# Patient Record
Sex: Female | Born: 1935 | Race: White | Hispanic: No | Marital: Married | Smoking: Never smoker
Health system: Southern US, Community
[De-identification: ages and names within clinical notes are randomized; demographics above are authoritative.]

## PROBLEM LIST (undated history)

## (undated) DIAGNOSIS — R059 Cough, unspecified: Secondary | ICD-10-CM

## (undated) DIAGNOSIS — F32A Depression, unspecified: Secondary | ICD-10-CM

## (undated) DIAGNOSIS — I10 Essential (primary) hypertension: Secondary | ICD-10-CM

## (undated) DIAGNOSIS — K589 Irritable bowel syndrome without diarrhea: Secondary | ICD-10-CM

## (undated) DIAGNOSIS — C4492 Squamous cell carcinoma of skin, unspecified: Secondary | ICD-10-CM

## (undated) DIAGNOSIS — R05 Cough: Secondary | ICD-10-CM

## (undated) DIAGNOSIS — G7100 Muscular dystrophy, unspecified: Secondary | ICD-10-CM

## (undated) DIAGNOSIS — Z974 Presence of external hearing-aid: Secondary | ICD-10-CM

## (undated) DIAGNOSIS — F329 Major depressive disorder, single episode, unspecified: Secondary | ICD-10-CM

## (undated) DIAGNOSIS — M1990 Unspecified osteoarthritis, unspecified site: Secondary | ICD-10-CM

## (undated) DIAGNOSIS — T753XXA Motion sickness, initial encounter: Secondary | ICD-10-CM

## (undated) HISTORY — PX: ABDOMINAL HYSTERECTOMY: SHX81

## (undated) HISTORY — DX: Squamous cell carcinoma of skin, unspecified: C44.92

---

## 2004-05-07 ENCOUNTER — Ambulatory Visit: Payer: Self-pay | Admitting: Urology

## 2004-05-07 IMAGING — CT CT ABD-PELV W/O CM
1 of 2 series · 15 of 32 positions shown, 19 images · non-contrast
Comparison: none

REASON FOR EXAM: Hematuria
COMMENTS:

[Series 2: soft tissue · axial · 0.59mm/px · z∈[-580,-262]mm · 15 of 120 slices shown, 19 images]
[im 9/120  soft-tissue]
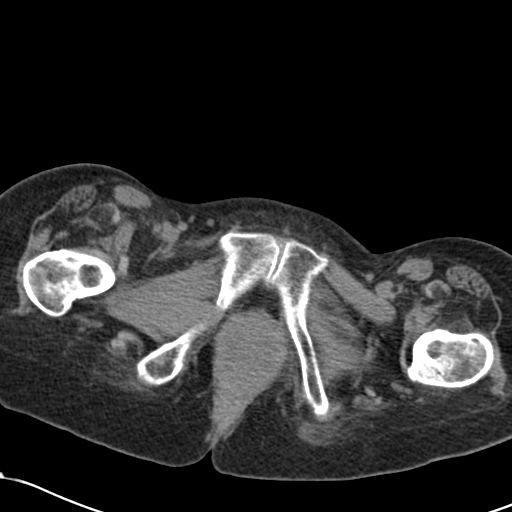
[im 9/120  bone]
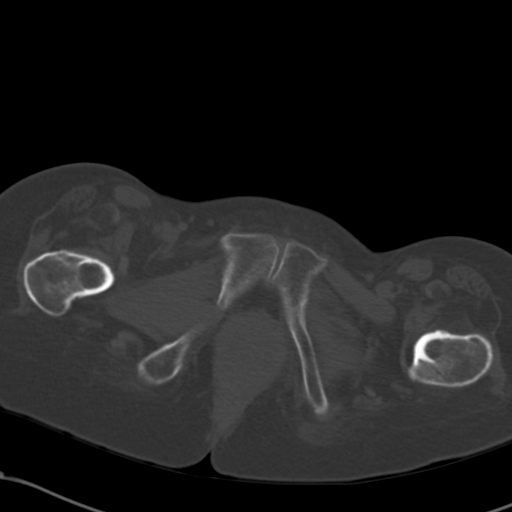
[im 17/120  soft-tissue]
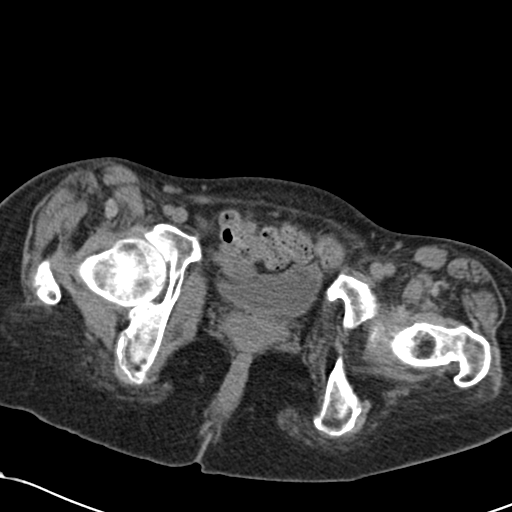
[im 25/120  soft-tissue]
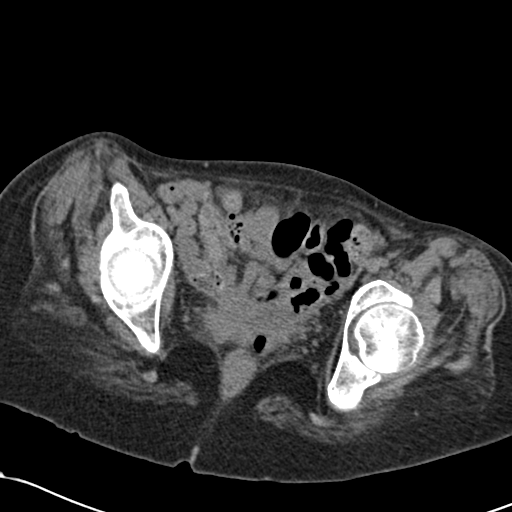
[im 33/120  soft-tissue]
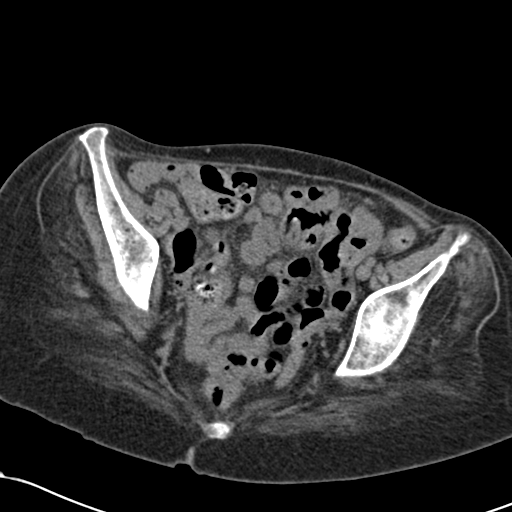
[im 42/120  soft-tissue]
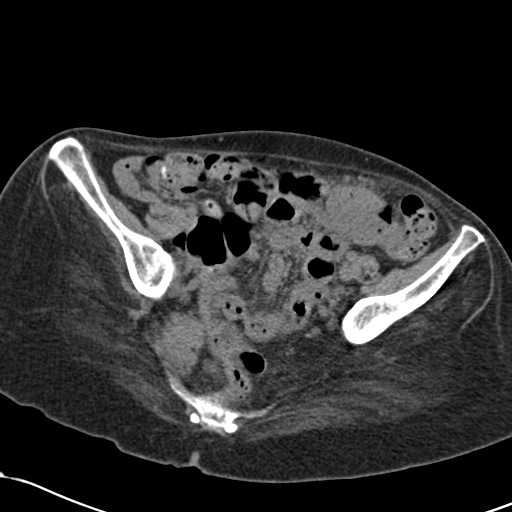
[im 50/120  soft-tissue]
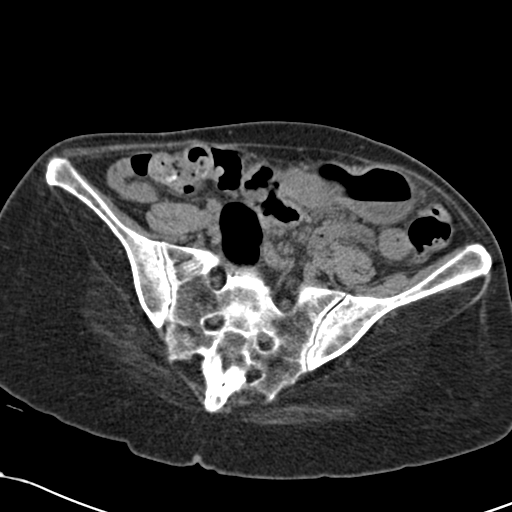
[im 62/120  soft-tissue]
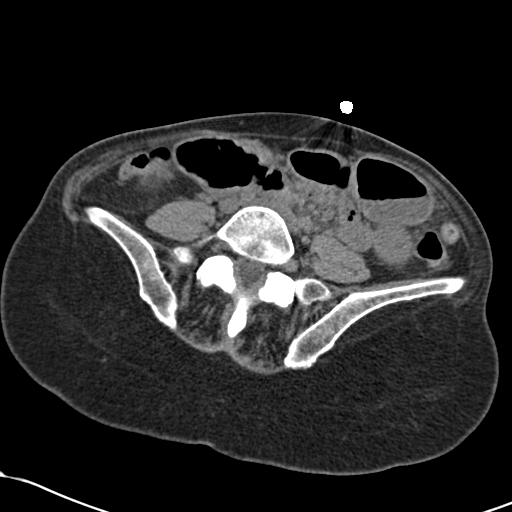
[im 70/120  soft-tissue]
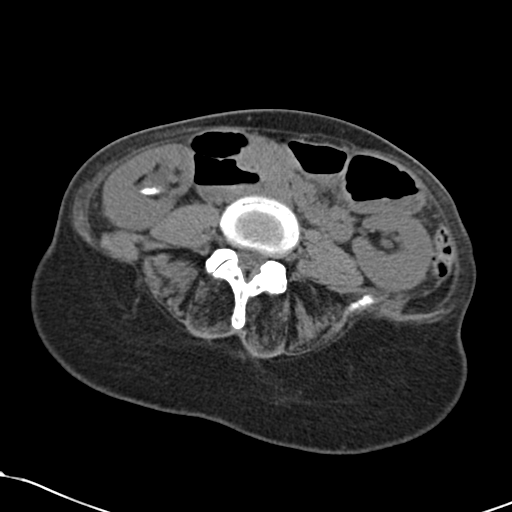
[im 78/120  soft-tissue]
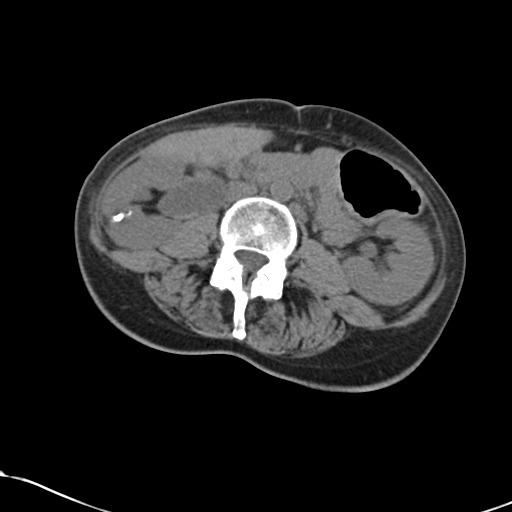
[im 78/120  bone]
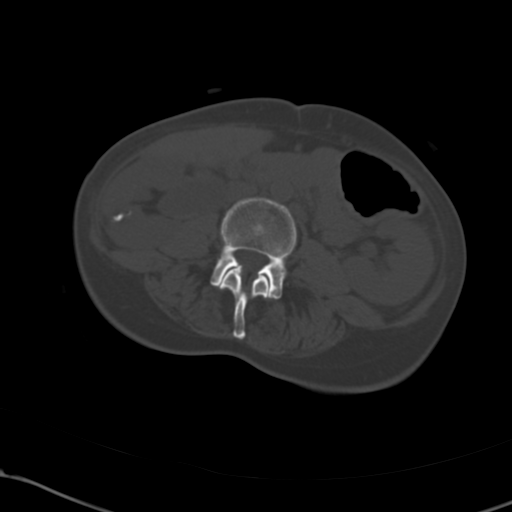
[im 87/120  soft-tissue]
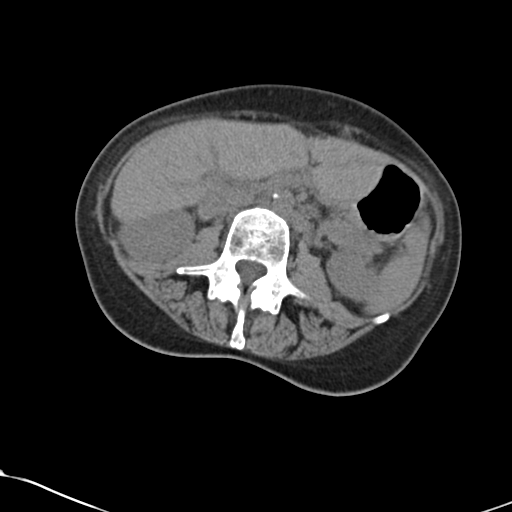
[im 95/120  soft-tissue]
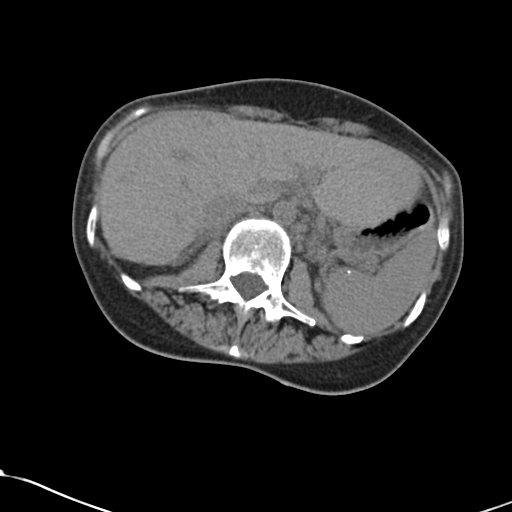
[im 103/120  soft-tissue]
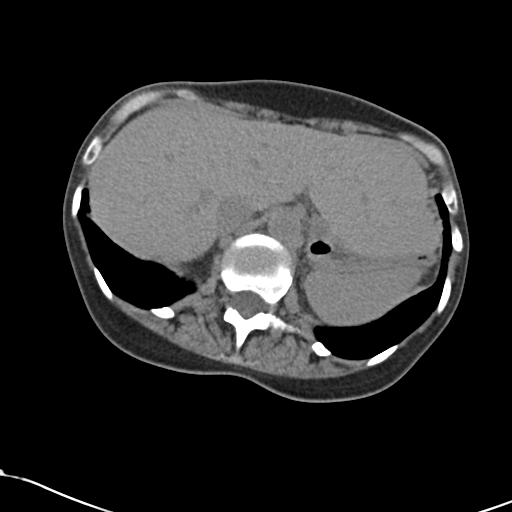
[im 103/120  lung]
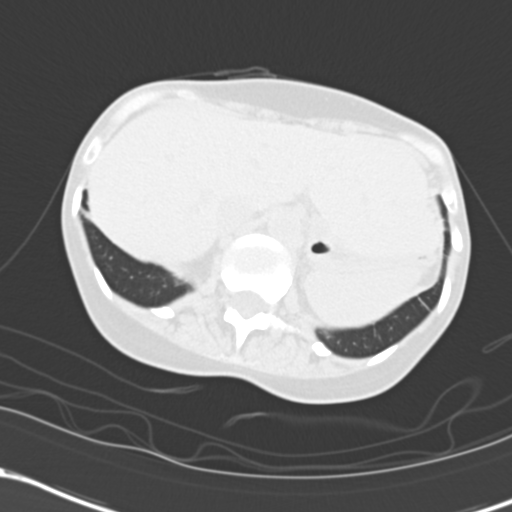
[im 107/120  lung]
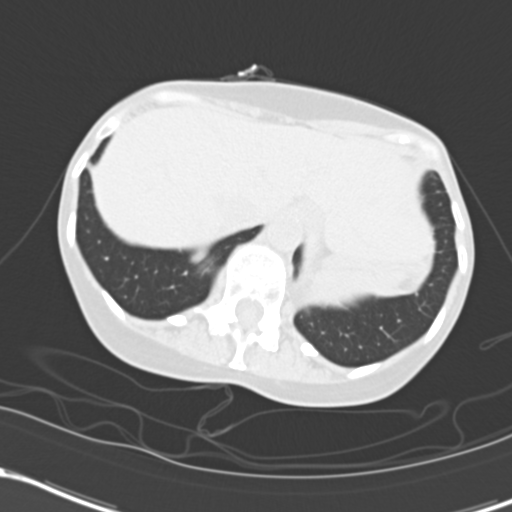
[im 111/120  soft-tissue]
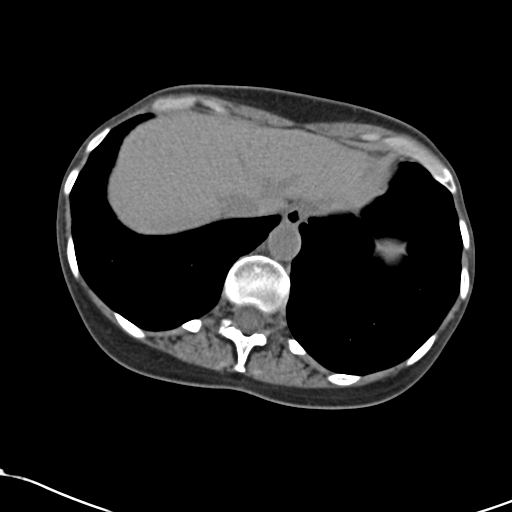
[im 111/120  lung]
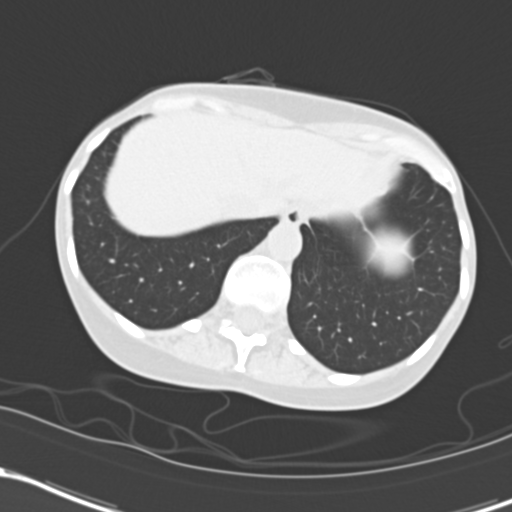
[im 115/120  lung]
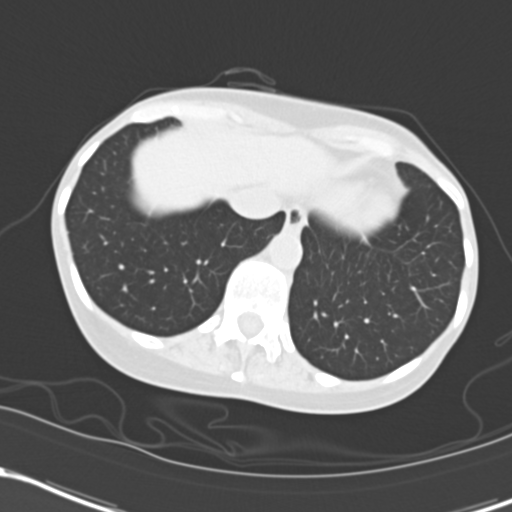

[15 of 32 positions shown; findings below may reference images not displayed]

PROCEDURE:     CT  - CT ABDOMEN AND PELVIS W[DATE]  [DATE]

RESULT:     The patient has unexplained hematuria.  The patient is
paraplegic.

The kidneys exhibit normal contour.  There is hydronephrosis on the RIGHT.
There are collecting system stones present.  There may be a midpole caliceal
stone as well.  The LEFT kidney exhibits mild prominence of the pelvis but a
definite stone on the LEFT is not seen.  I do not see an etiology for the
hydronephrosis on the RIGHT.  The RIGHT ureter can be followed only for a
short distance into the mid to lower abdomen.  There is a radiodensity seen
on images 82 through 85 that appears to be associated with a loop of bowel
but it lies along the expected course of the RIGHT ureter.  I cannot exclude
a stone here.  No UVJ stones are seen on the RIGHT or LEFT.  I see no free
fluid in the pelvis.  Considerable stool and gas is present within loops of
bowel in the abdomen and pelvis.  The uterus is small or surgically absent.
The lung bases appear clear.  The liver, spleen, nondistended stomach,
adrenal glands, and pancreas are grossly normal.  The caliber of the
abdominal aorta is normal.  The gallbladder is not definitely demonstrated
on this study.
IMPRESSION: There does appear to be mild hydronephrosis on the RIGHT.  A portion of this
may be related to extrarenal pelvis but there does indeed appear to be some
distention of the intrarenal collecting systems.  There are stones present
on the RIGHT, the largest of which measure approximately 5 mm in greatest
dimension.  A definite ureteral stone on the RIGHT is not seen.  There are
punctate non-obstructing stones present in the LEFT kidney.

There is a considerable amount of stool present throughout the small and
large bowel.  It is difficult to separate the ureters from these unopacified
bowel loops.

The gallbladder is not visualized on this study. It would be of value to
consider the patient for contrast-enhanced CT scanning of the abdomen and
pelvis or for an IVP if good preparation of the bowel can be obtained.

## 2004-05-17 ENCOUNTER — Ambulatory Visit: Payer: Self-pay | Admitting: Urology

## 2004-05-17 IMAGING — CR DG IVP HYPERTENSIVE
1 series · 8 of 10 positions shown · non-contrast
Comparison: none

REASON FOR EXAM: ALLERGY PREP. Hematuria
COMMENTS:

[Series 1: view not recorded · 0.17mm/px · 8 of 17 slices shown]
[im 1/17]
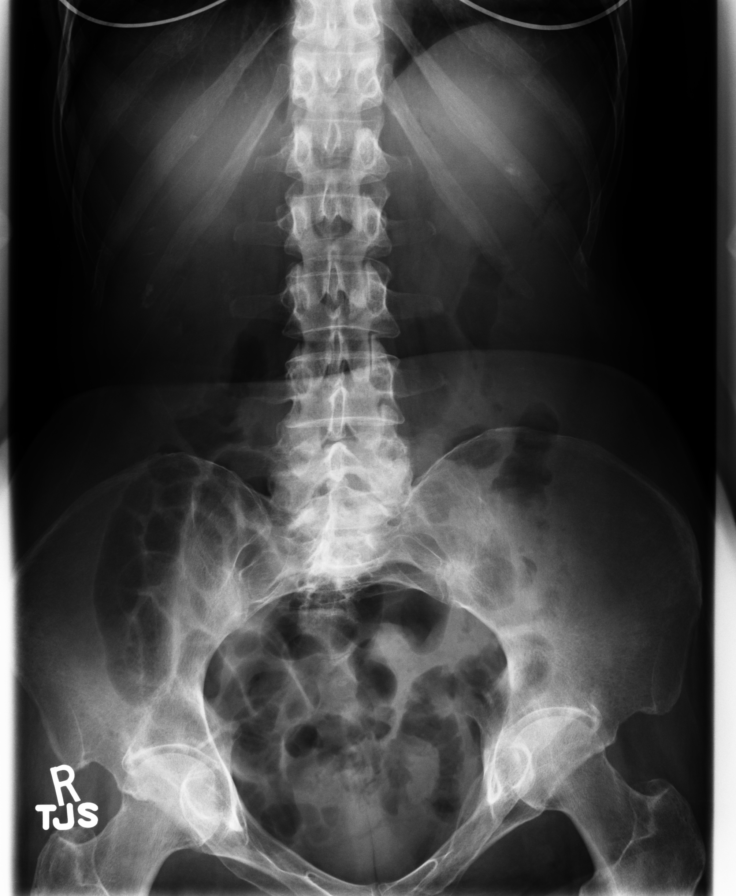
[im 2/17]
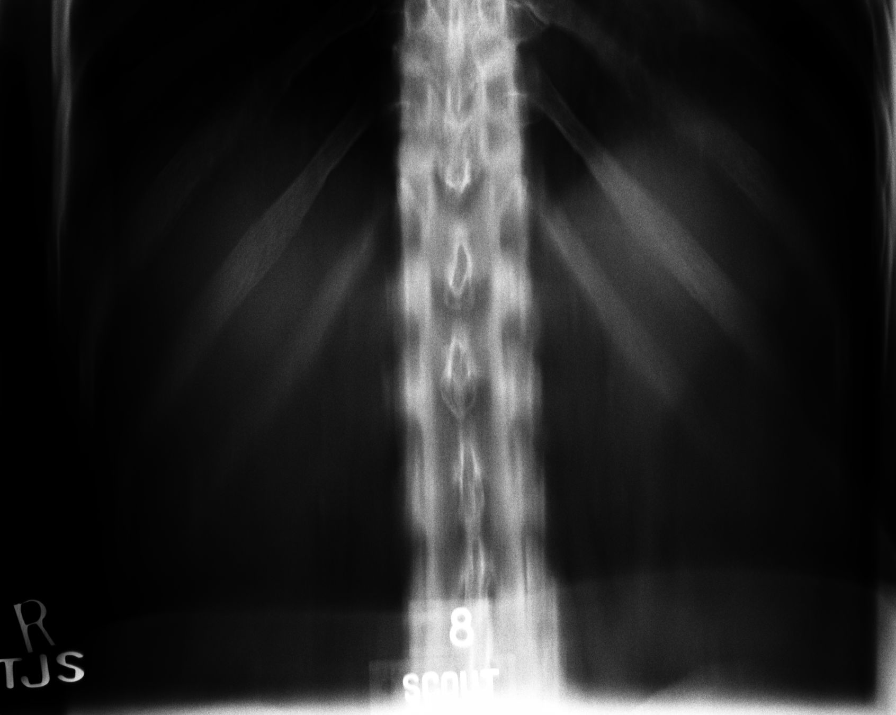
[im 4/17]
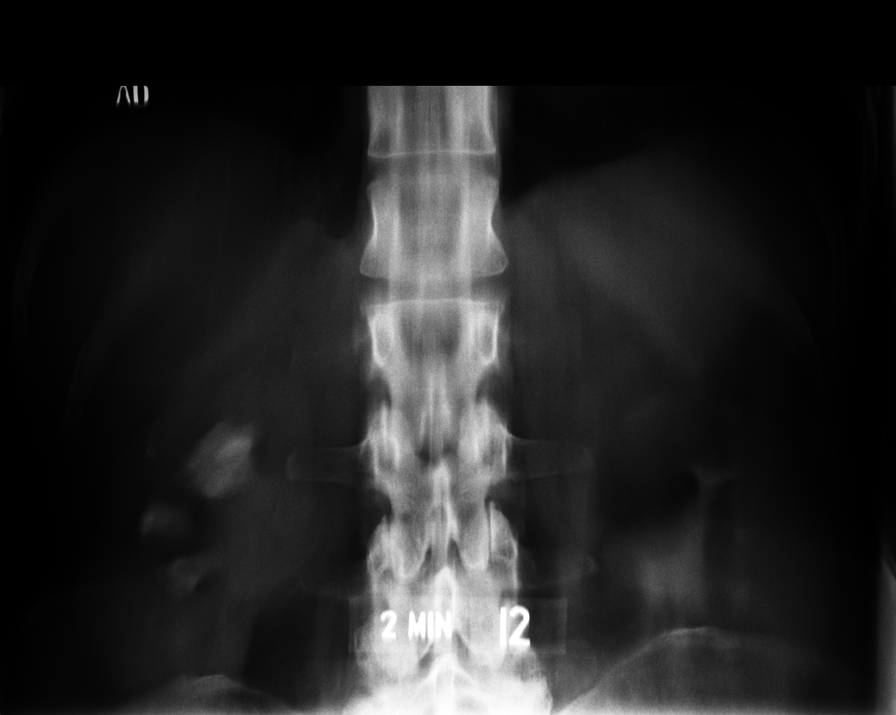
[im 6/17]
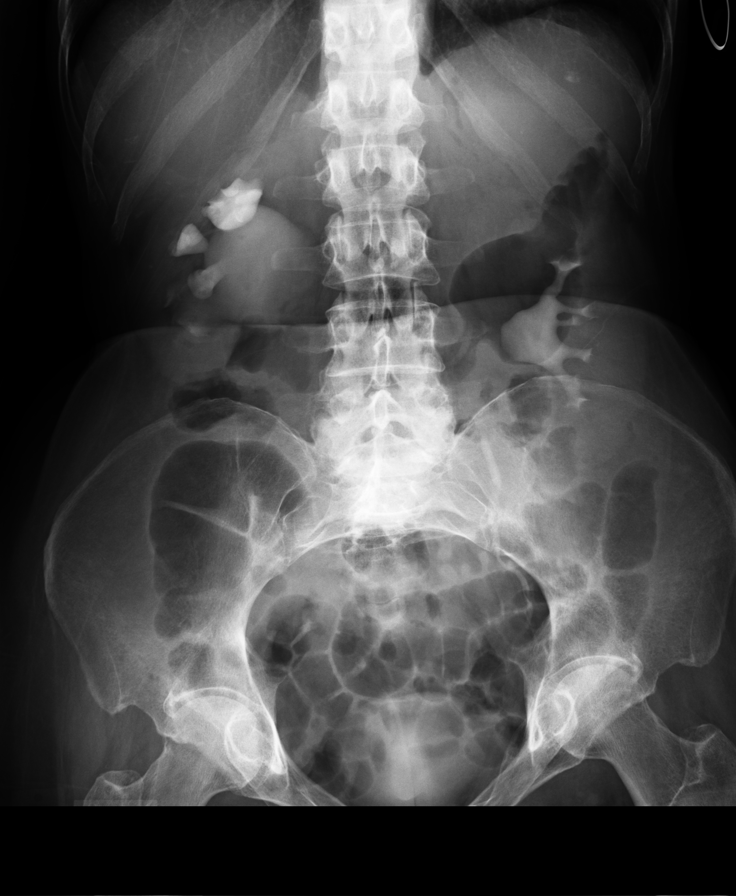
[im 8/17]
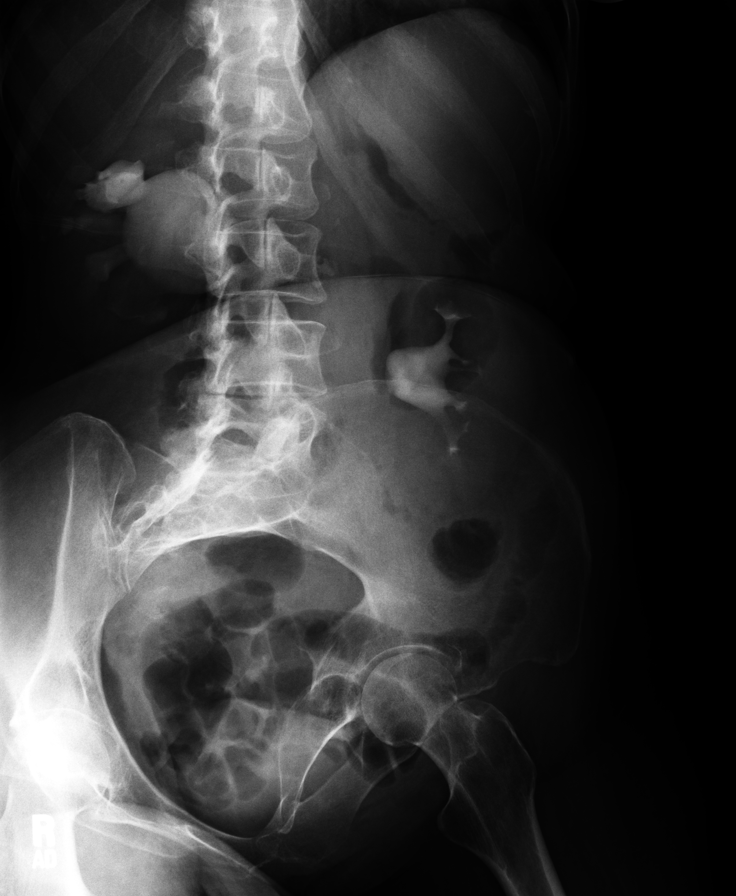
[im 9/17]
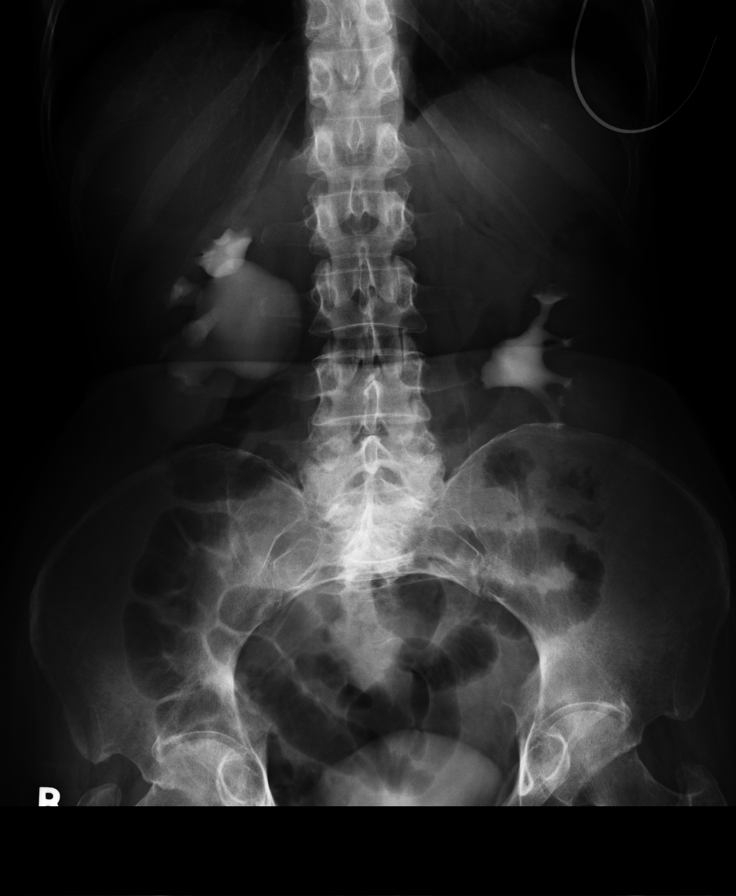
[im 11/17]
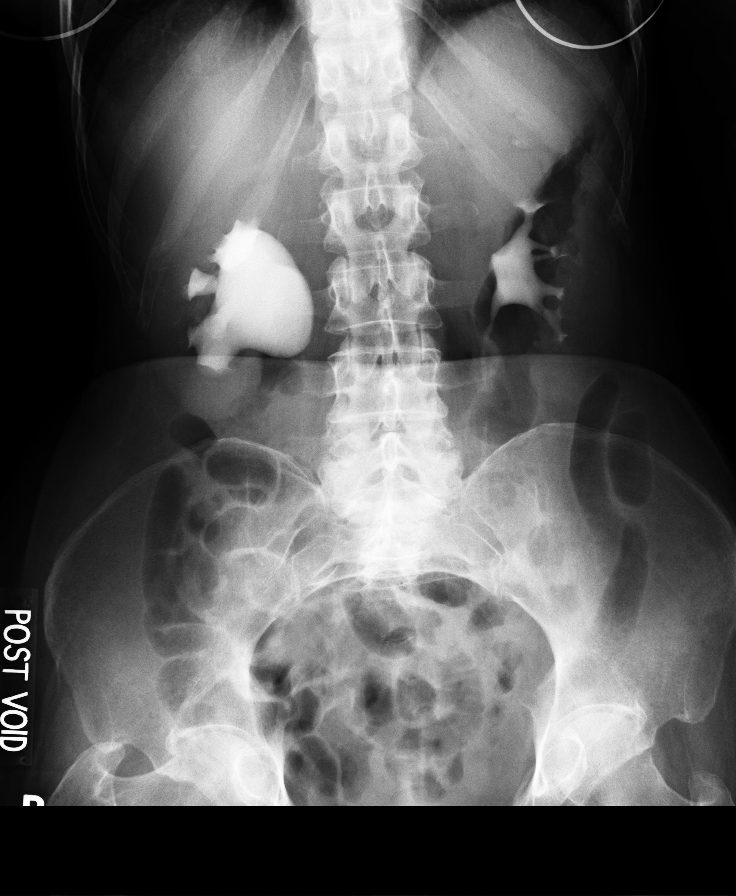
[im 13/17]
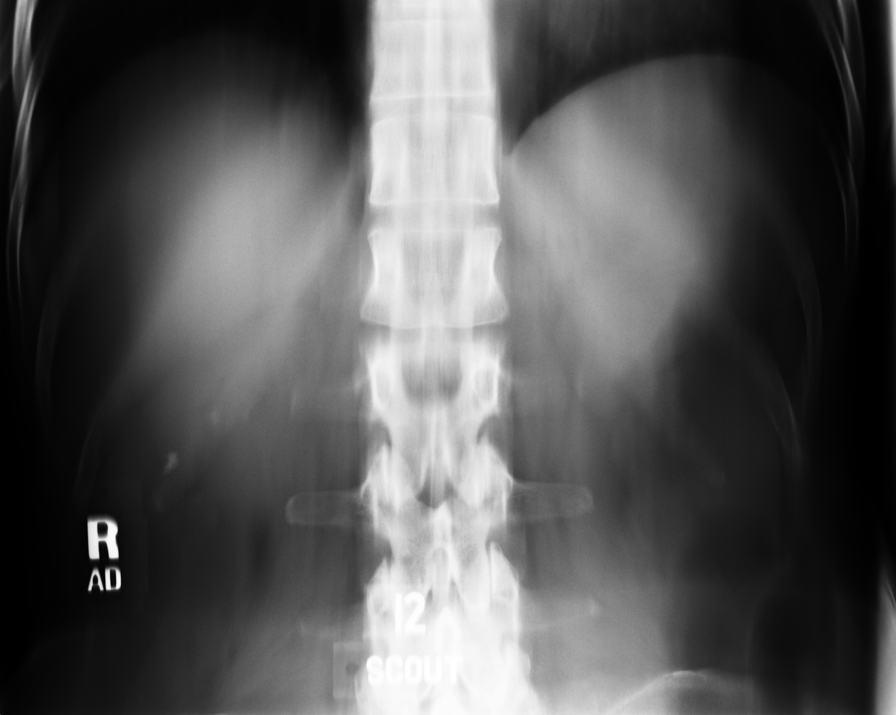

[8 of 10 positions shown; findings below may reference images not displayed]

PROCEDURE:     DXR - DXR INTRAVENOUS UROGRAPHY (IVP)  - [DATE] [DATE]

RESULT:     Scout film reveals no abnormal calcific densities.
Post-intravenous injection of contrast there is slight delay in the
excretion of contrast from the RIGHT kidney.  There is dilatation of the
pelvocaliceal system on the RIGHT with some mild loss of cortex in the upper
pole, which may be secondary to old chronic pyelonephritis.  The LEFT
collecting system appears within normal limits.  An extrarenal pelvis is
noted.  The LEFT ureter appears within normal limits.

On a few images it appears there is a small portion of the proximal RIGHT
ureter identified.  Findings would suggest obstruction of the pelviureteral
junction on the RIGHT, which may be secondary to scarring from previous
calculi or congenital in nature.

The urinary bladder was distended with no intrinsic abnormality seen and on
postvoid film empties well.  The upper collecting system on the RIGHT
remains distended on the post-void film.
IMPRESSION: RIGHT pelvocaliectasis with the possibility of scarring at the
vesicoureteral junction.  Other considerations would be a non-calcified
calculus , although no obvious defects are noted and/or congenital narrowing
could be a consideration.

## 2004-10-26 ENCOUNTER — Ambulatory Visit: Payer: Self-pay | Admitting: Urology

## 2004-10-26 IMAGING — US US RENAL KIDNEY
1 series · 17 of 25 positions shown · non-contrast
Comparison: none

REASON FOR EXAM: Right UPJ obstruction
COMMENTS:

[Series 1: us renal kidney · 17 of 38 slices shown]
[im 1/38]
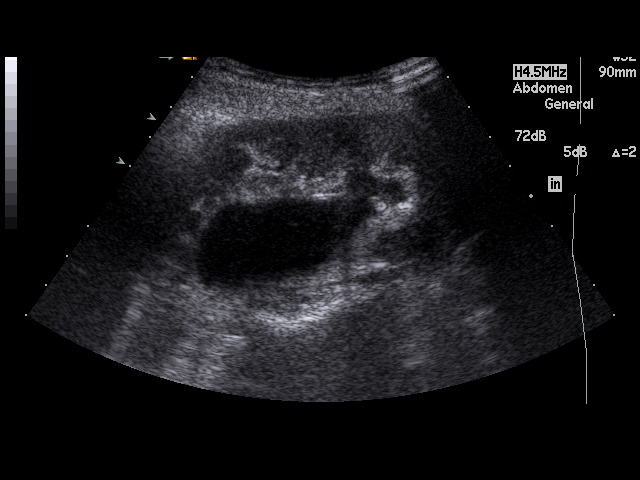
[im 4/38]
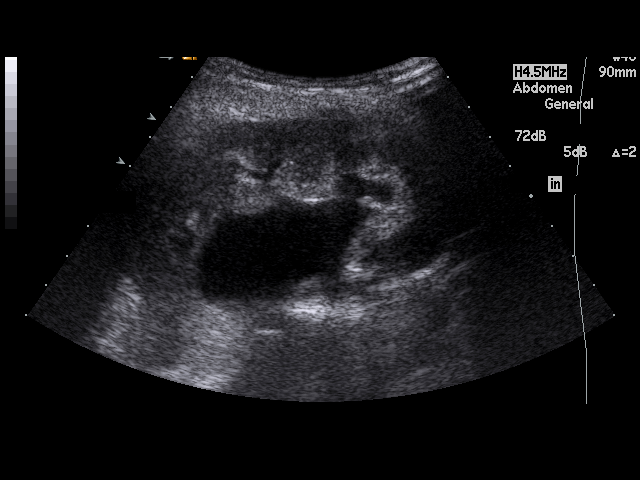
[im 5/38]
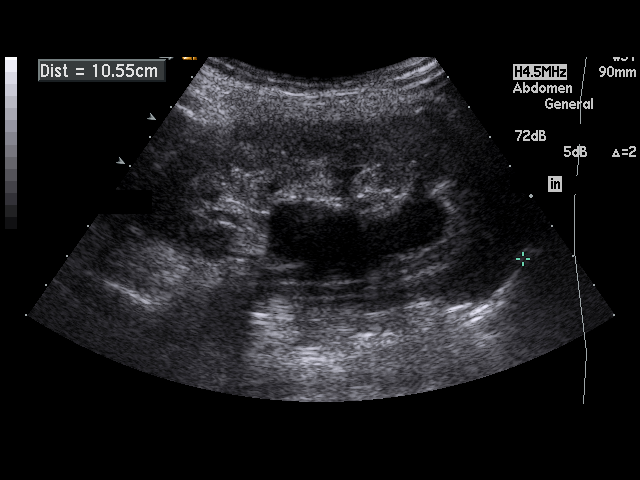
[im 8/38]
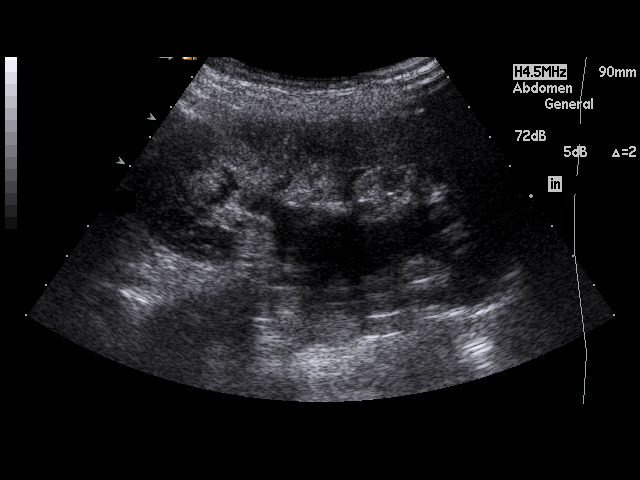
[im 10/38]
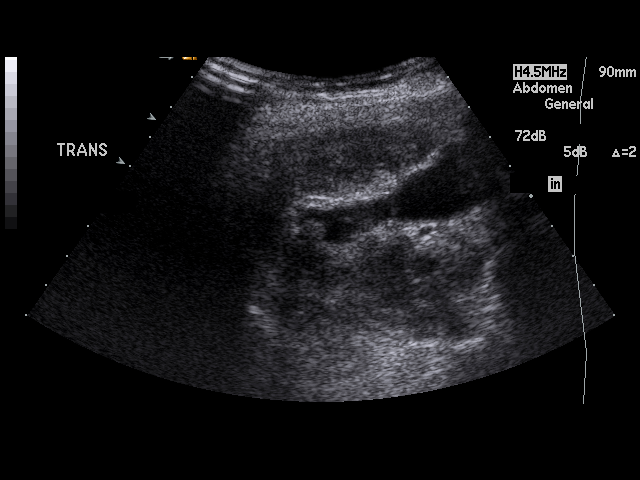
[im 13/38]
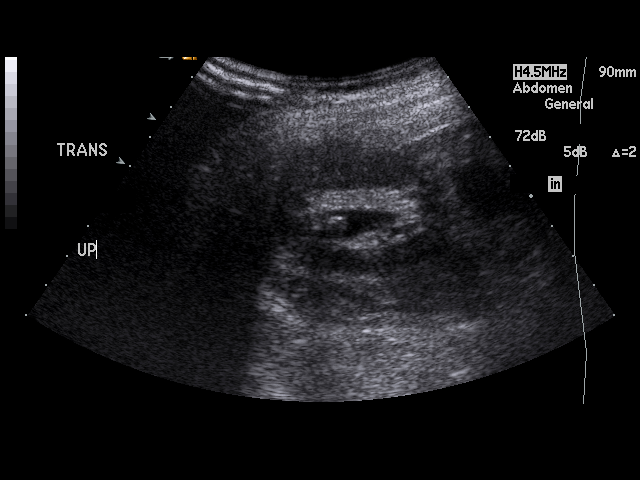
[im 14/38]
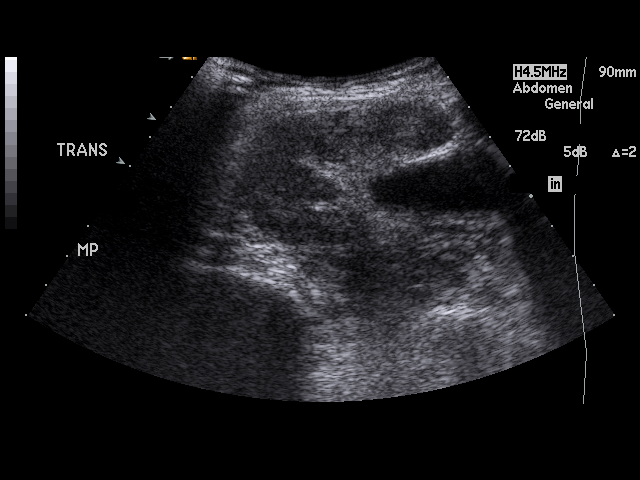
[im 17/38]
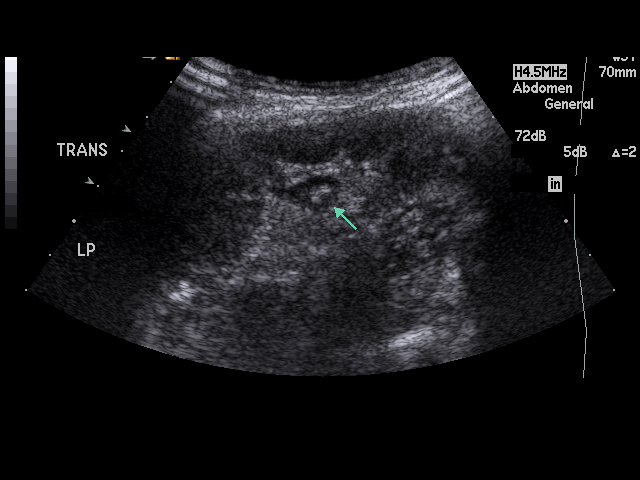
[im 19/38]
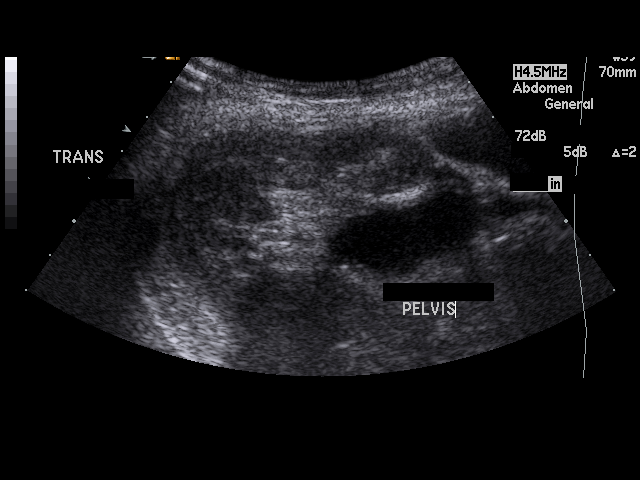
[im 21/38]
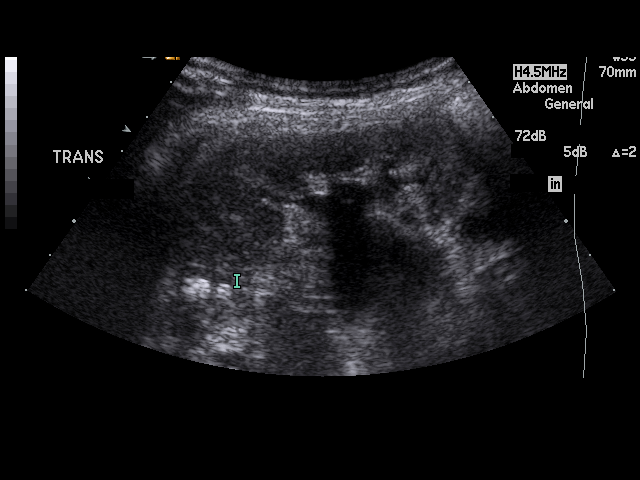
[im 24/38]
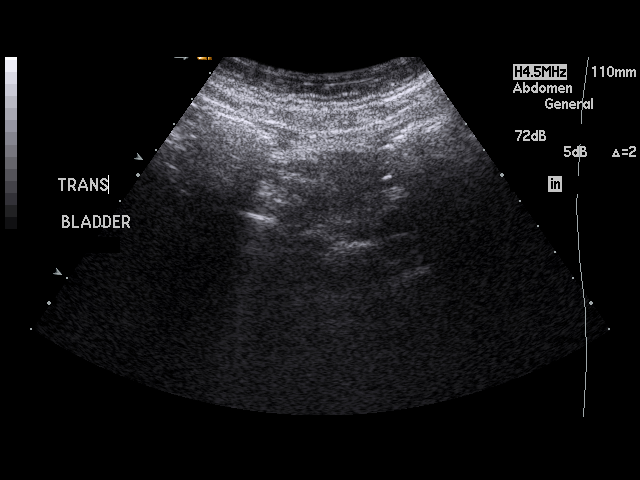
[im 25/38]
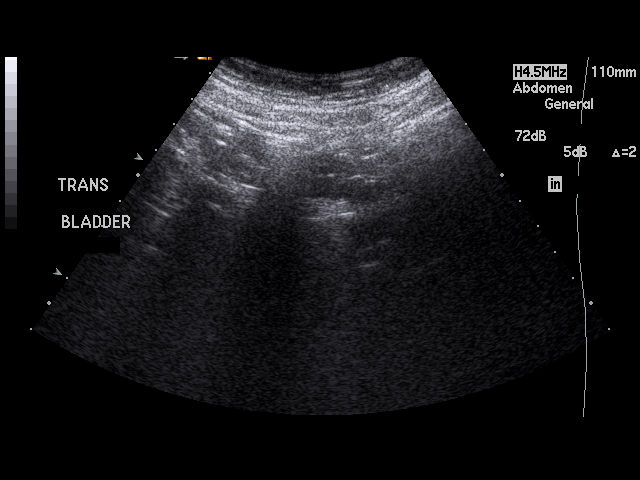
[im 28/38]
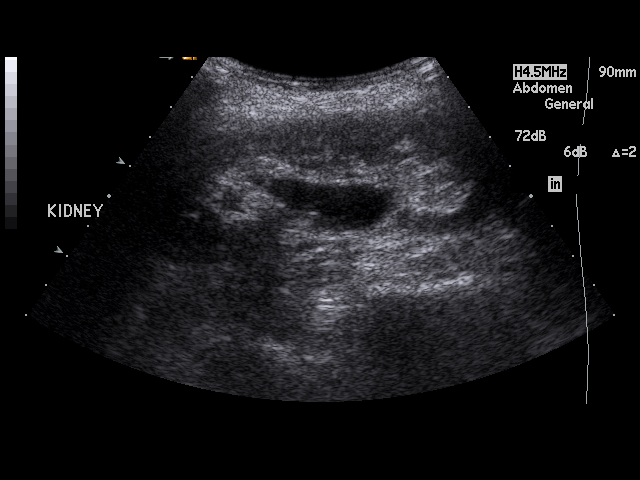
[im 30/38]
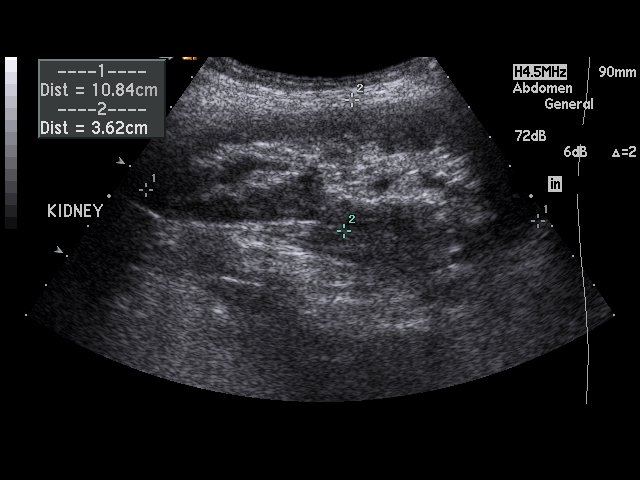
[im 33/38]
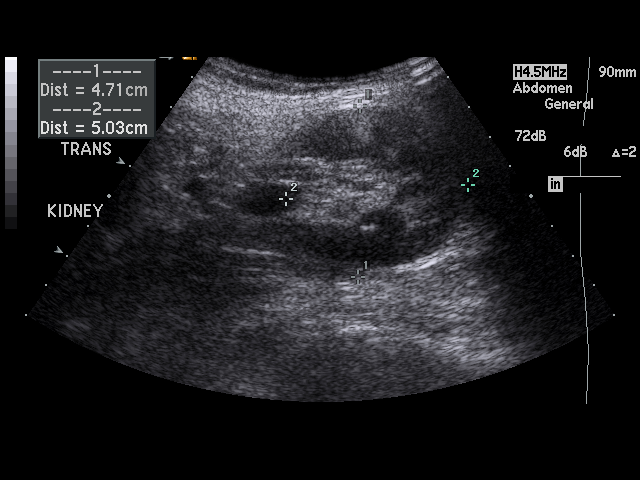
[im 34/38]
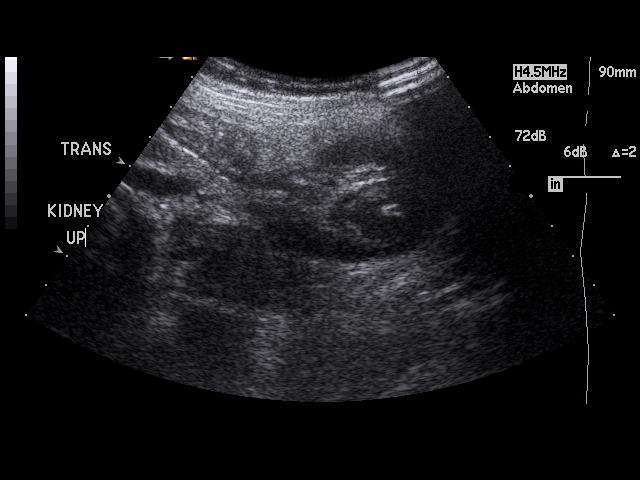
[im 38/38]
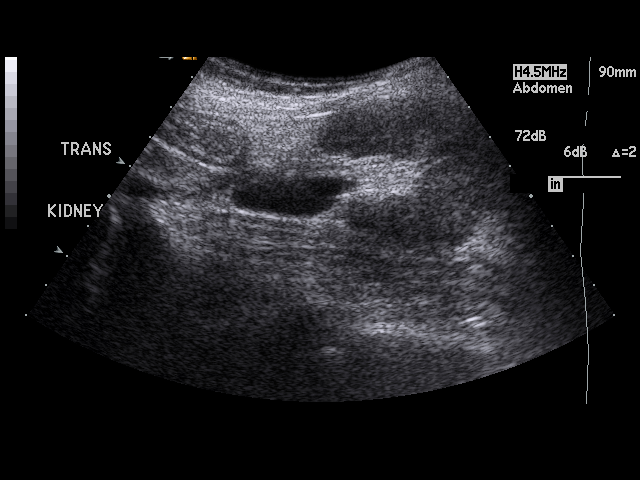

[17 of 25 positions shown; findings below may reference images not displayed]

PROCEDURE:     US  - US KIDNEY BILATERAL  - [DATE]  [DATE]

RESULT:     The RIGHT kidney measures 10.55 cm in length x 4.59 cm in AP
diameter. The LEFT kidney measures 10.84 cm in length x 4.71 cm in AP
diameter. There is moderate hydronephrosis on the RIGHT. No definite
hydronephrosis is seen on the LEFT but there is dilatation of the LEFT renal
pelvis. Echo densities are noted bilaterally compatible with renal
calcifications.
IMPRESSION: 1.     There is moderate hydronephrosis on the RIGHT.
2.     There is dilatation of the LEFT renal pelvis but no definite
hydronephrosis on the LEFT is seen.
3.     There are echo densities in both kidneys consistent with renal stones.

## 2004-11-01 ENCOUNTER — Ambulatory Visit: Payer: Self-pay | Admitting: Urology

## 2004-11-01 IMAGING — NM NM RENOGRAM W/ LASIX
2 series · 12 of 12 positions shown · non-contrast
Comparison: none

REASON FOR EXAM: Right UPJ obstruction
COMMENTS:

[Series 1: lasix ( (results) · 4.80mm/px · 6 of 38 frames shown]
[frame 4/38]
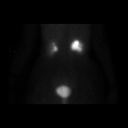
[frame 10/38]
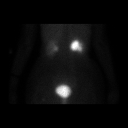
[frame 16/38]
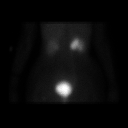
[frame 23/38]
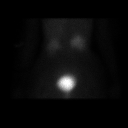
[frame 29/38]
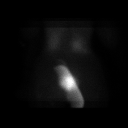
[frame 35/38]
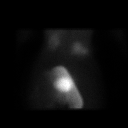

[Series 1: lasix · 4.80mm/px · 6 of 38 frames shown]
[frame 4/38]
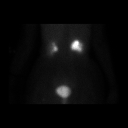
[frame 10/38]
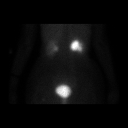
[frame 16/38]
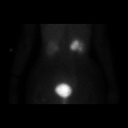
[frame 23/38]
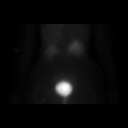
[frame 29/38]
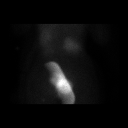
[frame 35/38]
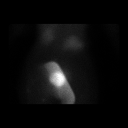

[12 of 12 positions shown; findings below may reference images not displayed]

PROCEDURE:     NM  - NM  RENAL LASIX  2 0F [DATE] [DATE]

RESULT:     Baseline renogram shows progressive accumulation of tracer
activity in the RIGHT renal pelvis throughout the course of the study. At
this point it is uncertain as to whether it is from obstructive or
non-obstructive etiology. Following administration of 20 mg Lasix IV, there
is noted progressive clearing of the increased contrast in the RIGHT renal
pelvis. At approximately 11 minutes there is near equal tracer activity in
each kidney. These findings are compatible with the absence of mechanical
obstruction on the RIGHT.
IMPRESSION: There is increased tracer activity in the RIGHT renal pelvis
which clears following administration of Lasix consistent with the absence
of high-grade mechanical obstruction.

## 2004-11-01 IMAGING — NM NM RENOGRAM W/ LASIX
2 series · 12 of 12 positions shown · non-contrast
Comparison: none

REASON FOR EXAM: Right UPJ obstruction
COMMENTS:

[Series 1: renal · 7.79mm/px · 6 of 100 frames shown]
[frame 9/100]
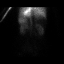
[frame 25/100]
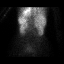
[frame 42/100]
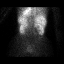
[frame 59/100]
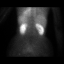
[frame 75/100]
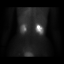
[frame 92/100]
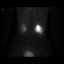

[Series 1: renal (results) · 7.79mm/px · 6 of 100 frames shown]
[frame 9/100]
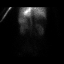
[frame 25/100]
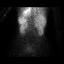
[frame 42/100]
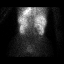
[frame 59/100]
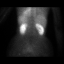
[frame 75/100]
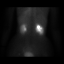
[frame 92/100]
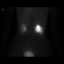

[12 of 12 positions shown; findings below may reference images not displayed]

PROCEDURE:     NM  - NM  RENAL LASIX  2 0F [DATE] [DATE]

RESULT:     Baseline renogram shows progressive accumulation of tracer
activity in the RIGHT renal pelvis throughout the course of the study. At
this point it is uncertain as to whether it is from obstructive or
non-obstructive etiology. Following administration of 20 mg Lasix IV, there
is noted progressive clearing of the increased contrast in the RIGHT renal
pelvis. At approximately 11 minutes there is near equal tracer activity in
each kidney. These findings are compatible with the absence of mechanical
obstruction on the RIGHT.
IMPRESSION: There is increased tracer activity in the RIGHT renal pelvis
which clears following administration of Lasix consistent with the absence
of high-grade mechanical obstruction.

## 2005-03-29 ENCOUNTER — Ambulatory Visit: Payer: Self-pay | Admitting: Ophthalmology

## 2014-02-25 DIAGNOSIS — I10 Essential (primary) hypertension: Secondary | ICD-10-CM | POA: Diagnosis present

## 2016-01-29 ENCOUNTER — Encounter: Payer: Self-pay | Admitting: *Deleted

## 2016-01-29 NOTE — Discharge Instructions (Signed)
Cataract Surgery, Care After °Refer to this sheet in the next few weeks. These instructions provide you with information about caring for yourself after your procedure. Your health care provider may also give you more specific instructions. Your treatment has been planned according to current medical practices, but problems sometimes occur. Call your health care provider if you have any problems or questions after your procedure. °What can I expect after the procedure? °After the procedure, it is common to have: °· Itching. °· Discomfort. °· Fluid discharge. °· Sensitivity to light and to touch. °· Bruising. °Follow these instructions at home: °Eye Care  °· Check your eye every day for signs of infection. Watch for: °¨ Redness, swelling, or pain. °¨ Fluid, blood, or pus. °¨ Warmth. °¨ Bad smell. °Activity  °· Avoid strenuous activities, such as playing contact sports, for as long as told by your health care provider. °· Do not drive or operate heavy machinery until your health care provider approves. °· Do not bend or lift heavy objects . Bending increases pressure in the eye. You can walk, climb stairs, and do light household chores. °· Ask your health care provider when you can return to work. If you work in a dusty environment, you may be advised to wear protective eyewear for a period of time. °General instructions  °· Take or apply over-the-counter and prescription medicines only as told by your health care provider. This includes eye drops. °· Do not touch or rub your eyes. °· If you were given a protective shield, wear it as told by your health care provider. If you were not given a protective shield, wear sunglasses as told by your health care provider to protect your eyes. °· Keep the area around your eye clean and dry. Avoid swimming or allowing water to hit you directly in the face while showering until told by your health care provider. Keep soap and shampoo out of your eyes. °· Do not put a contact lens  into the affected eye or eyes until your health care provider approves. °· Keep all follow-up visits as told by your health care provider. This is important. °Contact a health care provider if: ° °· You have increased bruising around your eye. °· You have pain that is not helped with medicine. °· You have a fever. °· You have redness, swelling, or pain in your eye. °· You have fluid, blood, or pus coming from your incision. °· Your vision gets worse. °Get help right away if: °· You have sudden vision loss. °This information is not intended to replace advice given to you by your health care provider. Make sure you discuss any questions you have with your health care provider. °Document Released: 08/20/2004 Document Revised: 06/11/2015 Document Reviewed: 12/11/2014 °Elsevier Interactive Patient Education © 2017 [employer] ° ° ° ° °General Anesthesia, Adult, Care After °These instructions provide you with information about caring for yourself after your procedure. Your health care provider may also give you more specific instructions. Your treatment has been planned according to current medical practices, but problems sometimes occur. Call your health care provider if you have any problems or questions after your procedure. °What can I expect after the procedure? °After the procedure, it is common to have: °· Vomiting. °· A sore throat. °· Mental slowness. °It is common to feel: °· Nauseous. °· Cold or shivery. °· Sleepy. °· Tired. °· Sore or achy, even in parts of your body where you did not have surgery. °Follow these instructions at   home: °For at least 24 hours after the procedure:  °· Do not: °¨ Participate in activities where you could fall or become injured. °¨ Drive. °¨ Use heavy machinery. °¨ Drink alcohol. °¨ Take sleeping pills or medicines that cause drowsiness. °¨ Make important decisions or sign legal documents. °¨ Take care of children on your own. °· Rest. °Eating and drinking  °· If you vomit, drink  water, juice, or soup when you can drink without vomiting. °· Drink enough fluid to keep your urine clear or pale yellow. °· Make sure you have little or no nausea before eating solid foods. °· Follow the diet recommended by your health care provider. °General instructions  °· Have a responsible adult stay with you until you are awake and alert. °· Return to your normal activities as told by your health care provider. Ask your health care provider what activities are safe for you. °· Take over-the-counter and prescription medicines only as told by your health care provider. °· If you smoke, do not smoke without supervision. °· Keep all follow-up visits as told by your health care provider. This is important. °Contact a health care provider if: °· You continue to have nausea or vomiting at home, and medicines are not helpful. °· You cannot drink fluids or start eating again. °· You cannot urinate after 8-12 hours. °· You develop a skin rash. °· You have fever. °· You have increasing redness at the site of your procedure. °Get help right away if: °· You have difficulty breathing. °· You have chest pain. °· You have unexpected bleeding. °· You feel that you are having a life-threatening or urgent problem. °This information is not intended to replace advice given to you by your health care provider. Make sure you discuss any questions you have with your health care provider. °Document Released: 05/09/2000 Document Revised: 07/06/2015 Document Reviewed: 01/15/2015 °Elsevier Interactive Patient Education © 2017 [employer] ° °

## 2016-02-03 ENCOUNTER — Ambulatory Visit
Admission: RE | Admit: 2016-02-03 | Discharge: 2016-02-03 | Disposition: A | Discharge status: Home / Self Care | Payer: Medicare Other | Source: Ambulatory Visit | Attending: Ophthalmology | Admitting: Ophthalmology

## 2016-02-03 ENCOUNTER — Ambulatory Visit: Payer: Medicare Other | Admitting: Anesthesiology

## 2016-02-03 ENCOUNTER — Encounter
Admission: RE | Disposition: A | Discharge status: Home / Self Care | Payer: Self-pay | Source: Ambulatory Visit | Attending: Ophthalmology

## 2016-02-03 DIAGNOSIS — Z91041 Radiographic dye allergy status: Secondary | ICD-10-CM | POA: Diagnosis not present

## 2016-02-03 DIAGNOSIS — H2511 Age-related nuclear cataract, right eye: Secondary | ICD-10-CM | POA: Diagnosis present

## 2016-02-03 DIAGNOSIS — I10 Essential (primary) hypertension: Secondary | ICD-10-CM | POA: Diagnosis not present

## 2016-02-03 HISTORY — DX: Muscular dystrophy, unspecified: G71.00

## 2016-02-03 HISTORY — DX: Motion sickness, initial encounter: T75.3XXA

## 2016-02-03 HISTORY — DX: Irritable bowel syndrome without diarrhea: K58.9

## 2016-02-03 HISTORY — DX: Depression, unspecified: F32.A

## 2016-02-03 HISTORY — PX: CATARACT EXTRACTION W/PHACO: SHX586

## 2016-02-03 HISTORY — DX: Major depressive disorder, single episode, unspecified: F32.9

## 2016-02-03 HISTORY — DX: Cough, unspecified: R05.9

## 2016-02-03 HISTORY — DX: Essential (primary) hypertension: I10

## 2016-02-03 HISTORY — DX: Presence of external hearing-aid: Z97.4

## 2016-02-03 HISTORY — DX: Cough: R05

## 2016-02-03 HISTORY — DX: Unspecified osteoarthritis, unspecified site: M19.90

## 2016-02-03 SURGERY — PHACOEMULSIFICATION, CATARACT, WITH IOL INSERTION
Anesthesia: Monitor Anesthesia Care | Site: Eye | Laterality: Right | Wound class: Clean

## 2016-02-03 MED ORDER — LIDOCAINE HCL (PF) 4 % IJ SOLN
INTRAOCULAR | Status: DC | PRN
  Administered 2016-02-03: 1.5 mL via OPHTHALMIC

## 2016-02-03 MED ORDER — BRIMONIDINE TARTRATE 0.2 % OP SOLN
OPHTHALMIC | Status: DC | PRN
  Administered 2016-02-03: 1 [drp] via OPHTHALMIC

## 2016-02-03 MED ORDER — EPINEPHRINE PF 1 MG/ML IJ SOLN
INTRAOCULAR | Status: DC | PRN
  Administered 2016-02-03: 50 mL via OPHTHALMIC

## 2016-02-03 MED ORDER — CEFUROXIME OPHTHALMIC INJECTION 1 MG/0.1 ML
INJECTION | OPHTHALMIC | Status: DC | PRN
  Administered 2016-02-03: .3 mL via OPHTHALMIC

## 2016-02-03 MED ORDER — TIMOLOL MALEATE 0.5 % OP SOLN
OPHTHALMIC | Status: DC | PRN
  Administered 2016-02-03: 1 [drp] via OPHTHALMIC

## 2016-02-03 MED ORDER — FENTANYL CITRATE (PF) 100 MCG/2ML IJ SOLN
INTRAMUSCULAR | Status: DC | PRN
  Administered 2016-02-03: 50 ug via INTRAVENOUS

## 2016-02-03 MED ORDER — ERYTHROMYCIN 5 MG/GM OP OINT
TOPICAL_OINTMENT | OPHTHALMIC | Status: DC | PRN
  Administered 2016-02-03: 1 via OPHTHALMIC

## 2016-02-03 MED ORDER — MOXIFLOXACIN HCL 0.5 % OP SOLN
1.0000 [drp] | OPHTHALMIC | Status: DC | PRN
  Administered 2016-02-03 (×3): 1 [drp] via OPHTHALMIC

## 2016-02-03 MED ORDER — ACETAMINOPHEN 160 MG/5ML PO SOLN: 325.0000 mg | ORAL | Status: DC | PRN

## 2016-02-03 MED ORDER — ARMC OPHTHALMIC DILATING DROPS
1.0000 "application " | OPHTHALMIC | Status: DC | PRN
  Administered 2016-02-03 (×3): 1 via OPHTHALMIC

## 2016-02-03 MED ORDER — NA HYALUR & NA CHOND-NA HYALUR 0.4-0.35 ML IO KIT
PACK | INTRAOCULAR | Status: DC | PRN
  Administered 2016-02-03: 1 mL via INTRAOCULAR

## 2016-02-03 MED ORDER — MIDAZOLAM HCL 2 MG/2ML IJ SOLN
INTRAMUSCULAR | Status: DC | PRN
  Administered 2016-02-03: 1 mg via INTRAVENOUS

## 2016-02-03 MED ORDER — ACETAMINOPHEN 325 MG PO TABS: 325.0000 mg | ORAL_TABLET | ORAL | Status: DC | PRN

## 2016-02-03 MED ORDER — LACTATED RINGERS IV SOLN: INTRAVENOUS | Status: DC

## 2016-02-03 SURGICAL SUPPLY — 25 items
CANNULA ANT/CHMB 27GA (MISCELLANEOUS) ×3 IMPLANT
CARTRIDGE ABBOTT (MISCELLANEOUS) IMPLANT
GLOVE SURG LX 7.5 STRW (GLOVE) ×2
GLOVE SURG LX STRL 7.5 STRW (GLOVE) ×1 IMPLANT
GLOVE SURG TRIUMPH 8.0 PF LTX (GLOVE) ×3 IMPLANT
GOWN STRL REUS W/ TWL LRG LVL3 (GOWN DISPOSABLE) ×2 IMPLANT
GOWN STRL REUS W/TWL LRG LVL3 (GOWN DISPOSABLE) ×4
LENS IOL TECNIS ITEC 20.5 (Intraocular Lens) ×3 IMPLANT
MARKER SKIN DUAL TIP RULER LAB (MISCELLANEOUS) ×3 IMPLANT
NDL RETROBULBAR .5 NSTRL (NEEDLE) IMPLANT
NEEDLE FILTER BLUNT 18X 1/2SAF (NEEDLE) ×2
NEEDLE FILTER BLUNT 18X1 1/2 (NEEDLE) ×1 IMPLANT
PACK CATARACT BRASINGTON (MISCELLANEOUS) ×3 IMPLANT
PACK EYE AFTER SURG (MISCELLANEOUS) ×3 IMPLANT
PACK OPTHALMIC (MISCELLANEOUS) ×3 IMPLANT
RING MALYGIN 7.0 (MISCELLANEOUS) IMPLANT
SUT ETHILON 10-0 CS-B-6CS-B-6 (SUTURE)
SUT VICRYL  9 0 (SUTURE)
SUT VICRYL 9 0 (SUTURE) IMPLANT
SUTURE EHLN 10-0 CS-B-6CS-B-6 (SUTURE) IMPLANT
SYR 3ML LL SCALE MARK (SYRINGE) ×3 IMPLANT
SYR 5ML LL (SYRINGE) ×3 IMPLANT
SYR TB 1ML LUER SLIP (SYRINGE) ×3 IMPLANT
WATER STERILE IRR 250ML POUR (IV SOLUTION) ×3 IMPLANT
WIPE NON LINTING 3.25X3.25 (MISCELLANEOUS) ×3 IMPLANT

## 2016-02-03 NOTE — H&P (Signed)
The History and Physical notes are on paper, have been signed, and are to be scanned. The patient remains stable and unchanged from the H&P.   Previous H&P reviewed, patient examined, and there are no changes.  Abigail Cole 02/03/2016 8:29 AM

## 2016-02-03 NOTE — Anesthesia Preprocedure Evaluation (Signed)
Anesthesia Evaluation  Patient identified by MRN, date of birth, ID band Patient awake    Reviewed: Allergy & Precautions, H&P , NPO status , Patient's Chart, lab work & pertinent test results  Airway Mallampati: II  TM Distance: >3 FB Neck ROM: full    Dental  (+) Edentulous Upper   Pulmonary    Pulmonary exam normal        Cardiovascular hypertension, Normal cardiovascular exam     Neuro/Psych PSYCHIATRIC DISORDERS  Neuromuscular disease    GI/Hepatic   Endo/Other    Renal/GU      Musculoskeletal   Abdominal   Peds  Hematology   Anesthesia Other Findings   Reproductive/Obstetrics                             Anesthesia Physical  Anesthesia Plan  ASA: III  Anesthesia Plan: MAC   Post-op Pain Management:    Induction:   Airway Management Planned:   Additional Equipment:   Intra-op Plan:   Post-operative Plan:   Informed Consent: I have reviewed the patients History and Physical, chart, labs and discussed the procedure including the risks, benefits and alternatives for the proposed anesthesia with the patient or authorized representative who has indicated his/her understanding and acceptance.     Plan Discussed with:   Anesthesia Plan Comments:         Anesthesia Quick Evaluation  

## 2016-02-03 NOTE — Anesthesia Postprocedure Evaluation (Signed)
Anesthesia Post Note  Patient: Abigail Cole  Procedure(s) Performed: Procedure(s) (LRB): CATARACT EXTRACTION PHACO AND INTRAOCULAR LENS PLACEMENT (IOC) (Right)  Patient location during evaluation: PACU Anesthesia Type: MAC Level of consciousness: awake and alert and oriented Pain management: satisfactory to patient Vital Signs Assessment: post-procedure vital signs reviewed and stable Respiratory status: spontaneous breathing, nonlabored ventilation and respiratory function stable Cardiovascular status: blood pressure returned to baseline and stable Postop Assessment: Adequate PO intake and No signs of nausea or vomiting Anesthetic complications: no    Raliegh Ip

## 2016-02-03 NOTE — Anesthesia Procedure Notes (Signed)
Procedure Name: MAC Date/Time: 02/03/2016 9:24 AM Performed by: Janna Arch Pre-anesthesia Checklist: Patient identified, Emergency Drugs available, Suction available and Patient being monitored Patient Re-evaluated:Patient Re-evaluated prior to inductionOxygen Delivery Method: Nasal cannula

## 2016-02-03 NOTE — Transfer of Care (Signed)
Immediate Anesthesia Transfer of Care Note  Patient: Abigail Cole  Procedure(s) Performed: Procedure(s): CATARACT EXTRACTION PHACO AND INTRAOCULAR LENS PLACEMENT (IOC) (Right)  Patient Location: PACU  Anesthesia Type: MAC  Level of Consciousness: awake, alert  and patient cooperative  Airway and Oxygen Therapy: Patient Spontanous Breathing and Patient connected to supplemental oxygen  Post-op Assessment: Post-op Vital signs reviewed, Patient's Cardiovascular Status Stable, Respiratory Function Stable, Patent Airway and No signs of Nausea or vomiting  Post-op Vital Signs: Reviewed and stable  Complications: No apparent anesthesia complications

## 2016-02-03 NOTE — Op Note (Signed)
LOCATION:  [facility]   PREOPERATIVE DIAGNOSIS:    Nuclear sclerotic cataract right eye. H25.11   POSTOPERATIVE DIAGNOSIS:  Nuclear sclerotic cataract right eye.     PROCEDURE:  Phacoemusification with posterior chamber intraocular lens placement of the right eye   LENS:   Implant Name Type Inv. Item Serial No. Manufacturer Lot No. LRB No. Used  LENS IOL DIOP 20.5 - [id number] Intraocular Lens LENS IOL DIOP 20.5 [id number]   Right 1        ULTRASOUND TIME: 18 % of 0 minutes, 49 seconds.  CDE 8.8   SURGEON:  Wyonia Hough, MD   ANESTHESIA:  Topical with tetracaine drops and 2% Xylocaine jelly, augmented with 1% preservative-free intracameral lidocaine.    COMPLICATIONS:  None.   DESCRIPTION OF PROCEDURE:  The patient was identified in the holding room and transported to the operating room and placed in the supine position under the operating microscope.  The right eye was identified as the operative eye and it was prepped and draped in the usual sterile ophthalmic fashion.   A 1 millimeter clear-corneal paracentesis was made at the 12:00 position.  0.5 ml of preservative-free 1% lidocaine was injected into the anterior chamber. The anterior chamber was filled with Viscoat viscoelastic.  A 2.4 millimeter keratome was used to make a near-clear corneal incision at the 9:00 position.  A curvilinear capsulorrhexis was made with a cystotome and capsulorrhexis forceps.  Balanced salt solution was used to hydrodissect and hydrodelineate the nucleus.   Phacoemulsification was then used in stop and chop fashion to remove the lens nucleus and epinucleus.  The remaining cortex was then removed using the irrigation and aspiration handpiece. Provisc was then placed into the capsular bag to distend it for lens placement.  A lens was then injected into the capsular bag.  The remaining viscoelastic was aspirated.   Wounds were hydrated with balanced salt solution.  The anterior  chamber was inflated to a physiologic pressure with balanced salt solution.  No wound leaks were noted.Cefuroxime 0.1 ml of a 10mg /ml solution was injected into the anterior chamber for a dose of 1 mg of intracameral antibiotic at the completion of the case.  Timolol and Brimonidine drops were placed followed by erythromycin ointment.  The eye was patched.  The patient was taken to the recovery room in stable condition without complications of anesthesia or surgery.      Abigail Cole 02/03/2016, 9:44 AM

## 2016-02-04 ENCOUNTER — Encounter: Payer: Self-pay | Admitting: Ophthalmology

## 2016-02-18 ENCOUNTER — Encounter: Payer: Self-pay | Admitting: *Deleted

## 2016-02-23 NOTE — Discharge Instructions (Signed)
Cataract Surgery, Care After °Refer to this sheet in the next few weeks. These instructions provide you with information about caring for yourself after your procedure. Your health care provider may also give you more specific instructions. Your treatment has been planned according to current medical practices, but problems sometimes occur. Call your health care provider if you have any problems or questions after your procedure. °What can I expect after the procedure? °After the procedure, it is common to have: °· Itching. °· Discomfort. °· Fluid discharge. °· Sensitivity to light and to touch. °· Bruising. °Follow these instructions at home: °Eye Care  °· Check your eye every day for signs of infection. Watch for: °¨ Redness, swelling, or pain. °¨ Fluid, blood, or pus. °¨ Warmth. °¨ Bad smell. °Activity  °· Avoid strenuous activities, such as playing contact sports, for as long as told by your health care provider. °· Do not drive or operate heavy machinery until your health care provider approves. °· Do not bend or lift heavy objects . Bending increases pressure in the eye. You can walk, climb stairs, and do light household chores. °· Ask your health care provider when you can return to work. If you work in a dusty environment, you may be advised to wear protective eyewear for a period of time. °General instructions  °· Take or apply over-the-counter and prescription medicines only as told by your health care provider. This includes eye drops. °· Do not touch or rub your eyes. °· If you were given a protective shield, wear it as told by your health care provider. If you were not given a protective shield, wear sunglasses as told by your health care provider to protect your eyes. °· Keep the area around your eye clean and dry. Avoid swimming or allowing water to hit you directly in the face while showering until told by your health care provider. Keep soap and shampoo out of your eyes. °· Do not put a contact lens  into the affected eye or eyes until your health care provider approves. °· Keep all follow-up visits as told by your health care provider. This is important. °Contact a health care provider if: ° °· You have increased bruising around your eye. °· You have pain that is not helped with medicine. °· You have a fever. °· You have redness, swelling, or pain in your eye. °· You have fluid, blood, or pus coming from your incision. °· Your vision gets worse. °Get help right away if: °· You have sudden vision loss. °This information is not intended to replace advice given to you by your health care provider. Make sure you discuss any questions you have with your health care provider. °Document Released: 08/20/2004 Document Revised: 06/11/2015 Document Reviewed: 12/11/2014 °Elsevier Interactive Patient Education © 2017 [employer] ° ° ° ° °General Anesthesia, Adult, Care After °These instructions provide you with information about caring for yourself after your procedure. Your health care provider may also give you more specific instructions. Your treatment has been planned according to current medical practices, but problems sometimes occur. Call your health care provider if you have any problems or questions after your procedure. °What can I expect after the procedure? °After the procedure, it is common to have: °· Vomiting. °· A sore throat. °· Mental slowness. °It is common to feel: °· Nauseous. °· Cold or shivery. °· Sleepy. °· Tired. °· Sore or achy, even in parts of your body where you did not have surgery. °Follow these instructions at   home: °For at least 24 hours after the procedure:  °· Do not: °¨ Participate in activities where you could fall or become injured. °¨ Drive. °¨ Use heavy machinery. °¨ Drink alcohol. °¨ Take sleeping pills or medicines that cause drowsiness. °¨ Make important decisions or sign legal documents. °¨ Take care of children on your own. °· Rest. °Eating and drinking  °· If you vomit, drink  water, juice, or soup when you can drink without vomiting. °· Drink enough fluid to keep your urine clear or pale yellow. °· Make sure you have little or no nausea before eating solid foods. °· Follow the diet recommended by your health care provider. °General instructions  °· Have a responsible adult stay with you until you are awake and alert. °· Return to your normal activities as told by your health care provider. Ask your health care provider what activities are safe for you. °· Take over-the-counter and prescription medicines only as told by your health care provider. °· If you smoke, do not smoke without supervision. °· Keep all follow-up visits as told by your health care provider. This is important. °Contact a health care provider if: °· You continue to have nausea or vomiting at home, and medicines are not helpful. °· You cannot drink fluids or start eating again. °· You cannot urinate after 8-12 hours. °· You develop a skin rash. °· You have fever. °· You have increasing redness at the site of your procedure. °Get help right away if: °· You have difficulty breathing. °· You have chest pain. °· You have unexpected bleeding. °· You feel that you are having a life-threatening or urgent problem. °This information is not intended to replace advice given to you by your health care provider. Make sure you discuss any questions you have with your health care provider. °Document Released: 05/09/2000 Document Revised: 07/06/2015 Document Reviewed: 01/15/2015 °Elsevier Interactive Patient Education © 2017 [employer] ° °

## 2016-02-24 ENCOUNTER — Ambulatory Visit: Payer: Medicare Other | Admitting: Anesthesiology

## 2016-02-24 ENCOUNTER — Ambulatory Visit
Admission: RE | Admit: 2016-02-24 | Discharge: 2016-02-24 | Disposition: A | Discharge status: Home / Self Care | Payer: Medicare Other | Source: Ambulatory Visit | Attending: Ophthalmology | Admitting: Ophthalmology

## 2016-02-24 ENCOUNTER — Encounter
Admission: RE | Disposition: A | Discharge status: Home / Self Care | Payer: Self-pay | Source: Ambulatory Visit | Attending: Ophthalmology

## 2016-02-24 DIAGNOSIS — I10 Essential (primary) hypertension: Secondary | ICD-10-CM | POA: Diagnosis not present

## 2016-02-24 DIAGNOSIS — Z993 Dependence on wheelchair: Secondary | ICD-10-CM | POA: Insufficient documentation

## 2016-02-24 DIAGNOSIS — G710 Muscular dystrophy: Secondary | ICD-10-CM | POA: Insufficient documentation

## 2016-02-24 DIAGNOSIS — H2512 Age-related nuclear cataract, left eye: Secondary | ICD-10-CM | POA: Insufficient documentation

## 2016-02-24 HISTORY — PX: CATARACT EXTRACTION W/PHACO: SHX586

## 2016-02-24 SURGERY — PHACOEMULSIFICATION, CATARACT, WITH IOL INSERTION
Anesthesia: Monitor Anesthesia Care | Site: Eye | Laterality: Left | Wound class: Clean

## 2016-02-24 MED ORDER — NA HYALUR & NA CHOND-NA HYALUR 0.4-0.35 ML IO KIT
PACK | INTRAOCULAR | Status: DC | PRN
  Administered 2016-02-24: 1 mL via INTRAOCULAR

## 2016-02-24 MED ORDER — LACTATED RINGERS IV SOLN: INTRAVENOUS | Status: DC

## 2016-02-24 MED ORDER — BALANCED SALT IO SOLN
INTRAOCULAR | Status: DC | PRN
  Administered 2016-02-24: 2 mL via OPHTHALMIC

## 2016-02-24 MED ORDER — FENTANYL CITRATE (PF) 100 MCG/2ML IJ SOLN
INTRAMUSCULAR | Status: DC | PRN
  Administered 2016-02-24: 50 ug via INTRAVENOUS

## 2016-02-24 MED ORDER — ARMC OPHTHALMIC DILATING DROPS
1.0000 "application " | OPHTHALMIC | Status: DC | PRN
  Administered 2016-02-24 (×3): 1 via OPHTHALMIC

## 2016-02-24 MED ORDER — CEFUROXIME OPHTHALMIC INJECTION 1 MG/0.1 ML
INJECTION | OPHTHALMIC | Status: DC | PRN
  Administered 2016-02-24: .3 mL via INTRACAMERAL

## 2016-02-24 MED ORDER — ERYTHROMYCIN 5 MG/GM OP OINT
TOPICAL_OINTMENT | OPHTHALMIC | Status: DC | PRN
  Administered 2016-02-24: 1 via OPHTHALMIC

## 2016-02-24 MED ORDER — MOXIFLOXACIN HCL 0.5 % OP SOLN
1.0000 [drp] | OPHTHALMIC | Status: DC | PRN
  Administered 2016-02-24 (×3): 1 [drp] via OPHTHALMIC

## 2016-02-24 MED ORDER — EPINEPHRINE PF 1 MG/ML IJ SOLN
INTRAOCULAR | Status: DC | PRN
  Administered 2016-02-24: 43 mL via OPHTHALMIC

## 2016-02-24 MED ORDER — MIDAZOLAM HCL 2 MG/2ML IJ SOLN
INTRAMUSCULAR | Status: DC | PRN
  Administered 2016-02-24: 1 mg via INTRAVENOUS

## 2016-02-24 MED ORDER — BRIMONIDINE TARTRATE-TIMOLOL 0.2-0.5 % OP SOLN
OPHTHALMIC | Status: DC | PRN
  Administered 2016-02-24: 1 [drp] via OPHTHALMIC

## 2016-02-24 SURGICAL SUPPLY — 26 items
BANDAGE EYE OVAL (MISCELLANEOUS) ×6 IMPLANT
CANNULA ANT/CHMB 27GA (MISCELLANEOUS) ×3 IMPLANT
CARTRIDGE ABBOTT (MISCELLANEOUS) IMPLANT
GLOVE SURG LX 7.5 STRW (GLOVE) ×4
GLOVE SURG LX STRL 7.5 STRW (GLOVE) ×2 IMPLANT
GLOVE SURG TRIUMPH 8.0 PF LTX (GLOVE) ×3 IMPLANT
GOWN STRL REUS W/ TWL LRG LVL3 (GOWN DISPOSABLE) ×2 IMPLANT
GOWN STRL REUS W/TWL LRG LVL3 (GOWN DISPOSABLE) ×4
LENS IOL TECNIS ITEC 19.5 (Intraocular Lens) ×3 IMPLANT
MARKER SKIN DUAL TIP RULER LAB (MISCELLANEOUS) ×3 IMPLANT
NDL RETROBULBAR .5 NSTRL (NEEDLE) IMPLANT
NEEDLE FILTER BLUNT 18X 1/2SAF (NEEDLE) ×2
NEEDLE FILTER BLUNT 18X1 1/2 (NEEDLE) ×1 IMPLANT
PACK CATARACT BRASINGTON (MISCELLANEOUS) ×3 IMPLANT
PACK EYE AFTER SURG (MISCELLANEOUS) ×3 IMPLANT
PACK OPTHALMIC (MISCELLANEOUS) ×3 IMPLANT
RING MALYGIN 7.0 (MISCELLANEOUS) IMPLANT
SUT ETHILON 10-0 CS-B-6CS-B-6 (SUTURE)
SUT VICRYL  9 0 (SUTURE)
SUT VICRYL 9 0 (SUTURE) IMPLANT
SUTURE EHLN 10-0 CS-B-6CS-B-6 (SUTURE) IMPLANT
SYR 3ML LL SCALE MARK (SYRINGE) ×3 IMPLANT
SYR 5ML LL (SYRINGE) ×3 IMPLANT
SYR TB 1ML LUER SLIP (SYRINGE) ×3 IMPLANT
WATER STERILE IRR 250ML POUR (IV SOLUTION) ×3 IMPLANT
WIPE NON LINTING 3.25X3.25 (MISCELLANEOUS) ×3 IMPLANT

## 2016-02-24 NOTE — Anesthesia Postprocedure Evaluation (Signed)
Anesthesia Post Note  Patient: Abigail Cole  Procedure(s) Performed: Procedure(s) (LRB): CATARACT EXTRACTION PHACO AND INTRAOCULAR LENS PLACEMENT (IOC) (Left)  Patient location during evaluation: PACU Anesthesia Type: MAC Level of consciousness: awake and alert Pain management: pain level controlled Vital Signs Assessment: post-procedure vital signs reviewed and stable Respiratory status: spontaneous breathing, nonlabored ventilation, respiratory function stable and patient connected to nasal cannula oxygen Cardiovascular status: stable and blood pressure returned to baseline Anesthetic complications: no    Marshell Levan

## 2016-02-24 NOTE — Anesthesia Preprocedure Evaluation (Signed)
Anesthesia Evaluation  Patient identified by MRN, date of birth, ID band Patient awake    Reviewed: Allergy & Precautions, H&P , NPO status , Patient's Chart, lab work & pertinent test results  Airway Mallampati: II  TM Distance: >3 FB Neck ROM: full    Dental  (+) Edentulous Upper   Pulmonary    Pulmonary exam normal        Cardiovascular hypertension, Normal cardiovascular exam     Neuro/Psych PSYCHIATRIC DISORDERS  Neuromuscular disease    GI/Hepatic   Endo/Other    Renal/GU      Musculoskeletal   Abdominal   Peds  Hematology   Anesthesia Other Findings   Reproductive/Obstetrics                             Anesthesia Physical  Anesthesia Plan  ASA: III  Anesthesia Plan: MAC   Post-op Pain Management:    Induction:   Airway Management Planned:   Additional Equipment:   Intra-op Plan:   Post-operative Plan:   Informed Consent: I have reviewed the patients History and Physical, chart, labs and discussed the procedure including the risks, benefits and alternatives for the proposed anesthesia with the patient or authorized representative who has indicated his/her understanding and acceptance.     Plan Discussed with:   Anesthesia Plan Comments:         Anesthesia Quick Evaluation

## 2016-02-24 NOTE — Anesthesia Procedure Notes (Signed)
Procedure Name: MAC Date/Time: 02/24/2016 9:57 AM Performed by: Janna Arch Pre-anesthesia Checklist: Patient identified, Emergency Drugs available, Suction available and Patient being monitored Patient Re-evaluated:Patient Re-evaluated prior to inductionOxygen Delivery Method: Nasal cannula

## 2016-02-24 NOTE — Transfer of Care (Signed)
Immediate Anesthesia Transfer of Care Note  Patient: Abigail Cole  Procedure(s) Performed: Procedure(s) with comments: CATARACT EXTRACTION PHACO AND INTRAOCULAR LENS PLACEMENT (IOC) (Left) - left eye IVA Topical  Patient Location: PACU  Anesthesia Type: MAC  Level of Consciousness: awake, alert  and patient cooperative  Airway and Oxygen Therapy: Patient Spontanous Breathing and Patient connected to supplemental oxygen  Post-op Assessment: Post-op Vital signs reviewed, Patient's Cardiovascular Status Stable, Respiratory Function Stable, Patent Airway and No signs of Nausea or vomiting  Post-op Vital Signs: Reviewed and stable  Complications: No apparent anesthesia complications

## 2016-02-24 NOTE — H&P (Signed)
The History and Physical notes are on paper, have been signed, and are to be scanned. The patient remains stable and unchanged from the H&P.   Previous H&P reviewed, patient examined, and there are no changes.  Abigail Cole 02/24/2016 8:49 AM

## 2016-02-24 NOTE — Op Note (Signed)
OPERATIVE NOTE  Abigail Cole [id number] 02/24/2016   PREOPERATIVE DIAGNOSIS:  Nuclear sclerotic cataract left eye. H25.12   POSTOPERATIVE DIAGNOSIS:    Nuclear sclerotic cataract left eye.     PROCEDURE:  Phacoemusification with posterior chamber intraocular lens placement of the left eye   LENS:   Implant Name Type Inv. Item Serial No. Manufacturer Lot No. LRB No. Used  LENS IOL DIOP 19.5 - [id number] Intraocular Lens LENS IOL DIOP 19.5 [id number]   Left 1        ULTRASOUND TIME: 19  % of 0 minutes 47 seconds, CDE 8.9  SURGEON:  Wyonia Hough, MD   ANESTHESIA:  Topical with tetracaine drops and 2% Xylocaine jelly, augmented with 1% preservative-free intracameral lidocaine.    COMPLICATIONS:  None.   DESCRIPTION OF PROCEDURE:  The patient was identified in the holding room and transported to the operating room and placed in the supine position under the operating microscope.  The left eye was identified as the operative eye and it was prepped and draped in the usual sterile ophthalmic fashion.   A 1 millimeter clear-corneal paracentesis was made at the 1:30 position.  0.5 ml of preservative-free 1% lidocaine was injected into the anterior chamber.  The anterior chamber was filled with Viscoat viscoelastic.  A 2.4 millimeter keratome was used to make a near-clear corneal incision at the 10:30 position.  .  A curvilinear capsulorrhexis was made with a cystotome and capsulorrhexis forceps.  Balanced salt solution was used to hydrodissect and hydrodelineate the nucleus.   Phacoemulsification was then used in stop and chop fashion to remove the lens nucleus and epinucleus.  The remaining cortex was then removed using the irrigation and aspiration handpiece. Provisc was then placed into the capsular bag to distend it for lens placement.  A lens was then injected into the capsular bag.  The remaining viscoelastic was aspirated.   Wounds were hydrated with balanced salt  solution.  The anterior chamber was inflated to a physiologic pressure with balanced salt solution.  No wound leaks were noted. Cefuroxime 0.1 ml of a 10mg /ml solution was injected into the anterior chamber for a dose of 1 mg of intracameral antibiotic at the completion of the case.   Timolol and Brimonidine drops were applied to the eye.  Erythromycin ointment was applied and the eye was patched and shielded. The patient was taken to the recovery room in stable condition without complications of anesthesia or surgery.  [name] 02/24/2016, 10:17 AM

## 2016-02-25 ENCOUNTER — Encounter: Payer: Self-pay | Admitting: Ophthalmology

## 2018-03-30 DIAGNOSIS — E782 Mixed hyperlipidemia: Secondary | ICD-10-CM | POA: Diagnosis present

## 2019-08-14 ENCOUNTER — Ambulatory Visit (INDEPENDENT_AMBULATORY_CARE_PROVIDER_SITE_OTHER): Payer: Medicare Other | Admitting: Dermatology

## 2019-08-14 ENCOUNTER — Other Ambulatory Visit: Payer: Self-pay

## 2019-08-14 DIAGNOSIS — L578 Other skin changes due to chronic exposure to nonionizing radiation: Secondary | ICD-10-CM | POA: Diagnosis not present

## 2019-08-14 DIAGNOSIS — L821 Other seborrheic keratosis: Secondary | ICD-10-CM | POA: Diagnosis not present

## 2019-08-14 DIAGNOSIS — L820 Inflamed seborrheic keratosis: Secondary | ICD-10-CM | POA: Diagnosis not present

## 2019-08-14 NOTE — Progress Notes (Signed)
   Follow-Up Visit   Subjective  Abigail Cole is a 86 y.o. female who presents for the following: Other (Spots of face, scalp, back that are irritating and itchy.).  The following portions of the chart were reviewed this encounter and updated as appropriate:  Tobacco  Allergies  Meds  Problems  Med Hx  Surg Hx  Fam Hx     Review of Systems:  No other skin or systemic complaints except as noted in HPI or Assessment and Plan.  Objective  Well appearing patient in no apparent distress; mood and affect are within normal limits.  A focused examination was performed including face, scalp, back. Relevant physical exam findings are noted in the Assessment and Plan.  Objective  Right Zygomatic Area x 1, left post scalp x 1, back x 2 (4): Erythematous keratotic or waxy stuck-on papule or plaque.    Assessment & Plan    Actinic Damage - diffuse scaly erythematous macules with underlying dyspigmentation - Recommend daily broad spectrum sunscreen SPF 30+ to sun-exposed areas, reapply every 2 hours as needed.  - Call for new or changing lesions.  Seborrheic Keratoses - Stuck-on, waxy, tan-brown papules and plaques  - Discussed benign etiology and prognosis. - Observe - Call for any changes   Inflamed seborrheic keratosis (4) Right Zygomatic Area x 1, left post scalp x 1, back x 2  Advised patient lesions on back may need a second treatment.  Destruction of lesion - Right Zygomatic Area x 1, left post scalp x 1, back x 2 Complexity: simple   Destruction method: cryotherapy   Informed consent: discussed and consent obtained   Timeout:  patient name, date of birth, surgical site, and procedure verified Lesion destroyed using liquid nitrogen: Yes   Region frozen until ice ball extended beyond lesion: Yes   Outcome: patient tolerated procedure well with no complications   Post-procedure details: wound care instructions given    Return in about 3 months (around 11/14/2019).    I, Ashok Cordia, CMA, am acting as scribe for Sarina Ser, MD .  Documentation: I have reviewed the above documentation for accuracy and completeness, and I agree with the above.  Sarina Ser, MD

## 2019-08-14 NOTE — Patient Instructions (Signed)

## 2019-08-26 ENCOUNTER — Encounter: Payer: Self-pay | Admitting: Dermatology

## 2019-11-20 ENCOUNTER — Ambulatory Visit (INDEPENDENT_AMBULATORY_CARE_PROVIDER_SITE_OTHER): Payer: Medicare Other | Admitting: Dermatology

## 2019-11-20 ENCOUNTER — Other Ambulatory Visit: Payer: Self-pay

## 2019-11-20 DIAGNOSIS — L820 Inflamed seborrheic keratosis: Secondary | ICD-10-CM | POA: Diagnosis not present

## 2019-11-20 DIAGNOSIS — L578 Other skin changes due to chronic exposure to nonionizing radiation: Secondary | ICD-10-CM

## 2019-11-20 DIAGNOSIS — L821 Other seborrheic keratosis: Secondary | ICD-10-CM | POA: Diagnosis not present

## 2019-11-20 NOTE — Progress Notes (Signed)
   Follow-Up Visit   Subjective  Abigail Cole is a 86 y.o. female who presents for the following: recheck ISK (back, L post scalp, R zygomatic) and check spots (L back, neck, growing).  The following portions of the chart were reviewed this encounter and updated as appropriate:  Tobacco  Allergies  Meds  Problems  Med Hx  Surg Hx  Fam Hx     Review of Systems:  No other skin or systemic complaints except as noted in HPI or Assessment and Plan.  Objective  Well appearing patient in no apparent distress; mood and affect are within normal limits.  A focused examination was performed including back, neck, face. Relevant physical exam findings are noted in the Assessment and Plan.  Objective  Left Flank x 2, L neck x 1 (3): Erythematous keratotic or waxy stuck-on papule or plaque.    Assessment & Plan    Seborrheic Keratoses - Stuck-on, waxy, tan-brown papules and plaques  - Discussed benign etiology and prognosis. - Observe - Call for any changes  Actinic Damage - diffuse scaly erythematous macules with underlying dyspigmentation - Recommend daily broad spectrum sunscreen SPF 30+ to sun-exposed areas, reapply every 2 hours as needed.  - Call for new or changing lesions.  Inflamed seborrheic keratosis (3) Left Flank x 2, L neck x 1  Destruction of lesion - Left Flank x 2, L neck x 1 Complexity: simple   Destruction method: cryotherapy   Informed consent: discussed and consent obtained   Timeout:  patient name, date of birth, surgical site, and procedure verified Lesion destroyed using liquid nitrogen: Yes   Region frozen until ice ball extended beyond lesion: Yes   Outcome: patient tolerated procedure well with no complications   Post-procedure details: wound care instructions given    Return in about 6 months (around 05/20/2020) for TBSE.  I, Othelia Pulling, RMA, am acting as scribe for Sarina Ser, MD .  Documentation: I have reviewed the above documentation  for accuracy and completeness, and I agree with the above.  Sarina Ser, MD

## 2019-11-21 ENCOUNTER — Encounter: Payer: Self-pay | Admitting: Dermatology

## 2020-05-20 ENCOUNTER — Encounter: Payer: Self-pay | Admitting: Dermatology

## 2020-05-20 ENCOUNTER — Other Ambulatory Visit: Payer: Self-pay

## 2020-05-20 ENCOUNTER — Ambulatory Visit (INDEPENDENT_AMBULATORY_CARE_PROVIDER_SITE_OTHER): Payer: Medicare Other | Admitting: Dermatology

## 2020-05-20 DIAGNOSIS — L820 Inflamed seborrheic keratosis: Secondary | ICD-10-CM

## 2020-05-20 DIAGNOSIS — L821 Other seborrheic keratosis: Secondary | ICD-10-CM | POA: Diagnosis not present

## 2020-05-20 DIAGNOSIS — L578 Other skin changes due to chronic exposure to nonionizing radiation: Secondary | ICD-10-CM

## 2020-05-20 DIAGNOSIS — D485 Neoplasm of uncertain behavior of skin: Secondary | ICD-10-CM | POA: Diagnosis not present

## 2020-05-20 NOTE — Progress Notes (Signed)
Follow-Up Visit   Subjective  Abigail Cole is a 87 y.o. female who presents for the following: back check (Patient declines TBSE today, but would like her back checked. She has a hx of ISK's ).  The following portions of the chart were reviewed this encounter and updated as appropriate:   Tobacco  Allergies  Meds  Problems  Med Hx  Surg Hx  Fam Hx     Review of Systems:  No other skin or systemic complaints except as noted in HPI or Assessment and Plan.  Objective  Well appearing patient in no apparent distress; mood and affect are within normal limits.  A focused examination was performed including face, back and arms. Relevant physical exam findings are noted in the Assessment and Plan.  Objective  Back x 27, R arm x 1 (28): Erythematous keratotic or waxy stuck-on papule or plaque.   Objective  L forearm: 0.8 cm Hyperkeratotic papule   Objective  R med tricep: 1.1 cm Hyperkeratotic papule   Assessment & Plan  Inflamed seborrheic keratosis (28) Back x 27, R arm x 1  Destruction of lesion - Back x 27, R arm x 1 Complexity: simple   Destruction method: cryotherapy   Informed consent: discussed and consent obtained   Timeout:  patient name, date of birth, surgical site, and procedure verified Lesion destroyed using liquid nitrogen: Yes   Region frozen until ice ball extended beyond lesion: Yes   Outcome: patient tolerated procedure well with no complications   Post-procedure details: wound care instructions given    Neoplasm of uncertain behavior of skin (2) L forearm  Epidermal / dermal shaving  Lesion diameter (cm):  0.8 Informed consent: discussed and consent obtained   Timeout: patient name, date of birth, surgical site, and procedure verified   Procedure prep:  Patient was prepped and draped in usual sterile fashion Prep type:  Isopropyl alcohol Anesthesia: the lesion was anesthetized in a standard fashion   Anesthetic:  1% lidocaine w/ epinephrine  1-100,000 buffered w/ 8.4% NaHCO3 Instrument used: flexible razor blade   Hemostasis achieved with: pressure, aluminum chloride and electrodesiccation   Outcome: patient tolerated procedure well   Post-procedure details: sterile dressing applied and wound care instructions given   Dressing type: bandage and petrolatum    Destruction of lesion Complexity: extensive   Destruction method: electrodesiccation and curettage   Informed consent: discussed and consent obtained   Timeout:  patient name, date of birth, surgical site, and procedure verified Procedure prep:  Patient was prepped and draped in usual sterile fashion Prep type:  Isopropyl alcohol Anesthesia: the lesion was anesthetized in a standard fashion   Anesthetic:  1% lidocaine w/ epinephrine 1-100,000 buffered w/ 8.4% NaHCO3 Curettage performed in three different directions: Yes   Electrodesiccation performed over the curetted area: Yes   Lesion length (cm):  0.8 Lesion width (cm):  0.8 Margin per side (cm):  0.2 Final wound size (cm):  1.2 Hemostasis achieved with:  pressure, aluminum chloride and electrodesiccation Outcome: patient tolerated procedure well with no complications   Post-procedure details: sterile dressing applied and wound care instructions given   Dressing type: bandage and petrolatum    Specimen 1 - Surgical pathology Differential Diagnosis: D48.5 ISK r/o SCC  ED&C today  Check Margins: No Hyperkeratotic papule 0.8cm   R med tricep  Epidermal / dermal shaving  Lesion diameter (cm):  1.1 Informed consent: discussed and consent obtained   Timeout: patient name, date of birth, surgical site,  and procedure verified   Procedure prep:  Patient was prepped and draped in usual sterile fashion Prep type:  Isopropyl alcohol Anesthesia: the lesion was anesthetized in a standard fashion   Anesthetic:  1% lidocaine w/ epinephrine 1-100,000 buffered w/ 8.4% NaHCO3 Instrument used: flexible razor blade    Hemostasis achieved with: pressure, aluminum chloride and electrodesiccation   Outcome: patient tolerated procedure well   Post-procedure details: sterile dressing applied and wound care instructions given   Dressing type: bandage and petrolatum    Destruction of lesion Complexity: extensive   Destruction method: electrodesiccation and curettage   Informed consent: discussed and consent obtained   Timeout:  patient name, date of birth, surgical site, and procedure verified Procedure prep:  Patient was prepped and draped in usual sterile fashion Prep type:  Isopropyl alcohol Anesthesia: the lesion was anesthetized in a standard fashion   Anesthetic:  1% lidocaine w/ epinephrine 1-100,000 buffered w/ 8.4% NaHCO3 Curettage performed in three different directions: Yes   Electrodesiccation performed over the curetted area: Yes   Lesion length (cm):  1.1 Lesion width (cm):  1.1 Margin per side (cm):  0.2 Final wound size (cm):  1.5 Hemostasis achieved with:  pressure, aluminum chloride and electrodesiccation Outcome: patient tolerated procedure well with no complications   Post-procedure details: sterile dressing applied and wound care instructions given   Dressing type: bandage and petrolatum    Specimen 2 - Surgical pathology Differential Diagnosis: D48.5 ISK r/o ISK  ED&C today  Check Margins: No Hyperkeratotic papule 1.1 cm    Seborrheic Keratoses - Stuck-on, waxy, tan-brown papules and/or plaques  - Benign-appearing - Discussed benign etiology and prognosis. - Observe - Call for any changes  Actinic Damage - chronic, secondary to cumulative UV radiation exposure/sun exposure over time - diffuse scaly erythematous macules with underlying dyspigmentation - Recommend daily broad spectrum sunscreen SPF 30+ to sun-exposed areas, reapply every 2 hours as needed.  - Recommend staying in the shade or wearing long sleeves, sun glasses (UVA+UVB protection) and wide brim hats (4-inch  brim around the entire circumference of the hat). - Call for new or changing lesions.  Return in about 6 months (around 11/19/2020) for ISK recheck .  Luther Redo, CMA, am acting as scribe for Sarina Ser, MD .  Documentation: I have reviewed the above documentation for accuracy and completeness, and I agree with the above.  Sarina Ser, MD

## 2020-05-20 NOTE — Patient Instructions (Signed)

## 2020-05-26 ENCOUNTER — Telehealth: Payer: Self-pay

## 2020-05-26 NOTE — Telephone Encounter (Signed)
Left message for patient to call office for results/hd 

## 2020-05-26 NOTE — Telephone Encounter (Signed)
-----   Message from Ralene Bathe, MD sent at 05/25/2020  3:42 PM EDT ----- Diagnosis 1. Skin , left forearm SEBORRHEIC KERATOSIS, IRRITATED 2. Skin , right med tricep VERRUCA VULGARIS, ENDOPHYTIC TYPE, INFLAMED  1- benign irritated keratosis 2- benign viral wart May recur Recheck next visit

## 2020-05-26 NOTE — Telephone Encounter (Signed)
Patient's care taker advised of BX results.

## 2020-11-25 ENCOUNTER — Encounter: Payer: Self-pay | Admitting: Dermatology

## 2020-11-25 ENCOUNTER — Other Ambulatory Visit: Payer: Self-pay

## 2020-11-25 ENCOUNTER — Ambulatory Visit (INDEPENDENT_AMBULATORY_CARE_PROVIDER_SITE_OTHER): Payer: Medicare Other | Admitting: Dermatology

## 2020-11-25 DIAGNOSIS — L821 Other seborrheic keratosis: Secondary | ICD-10-CM

## 2020-11-25 DIAGNOSIS — L578 Other skin changes due to chronic exposure to nonionizing radiation: Secondary | ICD-10-CM

## 2020-11-25 DIAGNOSIS — L820 Inflamed seborrheic keratosis: Secondary | ICD-10-CM

## 2020-11-25 DIAGNOSIS — L570 Actinic keratosis: Secondary | ICD-10-CM

## 2020-11-25 NOTE — Patient Instructions (Signed)

## 2020-11-25 NOTE — Progress Notes (Signed)
   Follow-Up Visit   Subjective  Abigail Cole is a 87 y.o. female who presents for the following: recheck ISK's (On the back - previously tx with LN2) and irregular skin lesion (On the L hand dorsum - patient states lesion is at site of previous skin cancer and is sometimes itchy and irritated.).  The following portions of the chart were reviewed this encounter and updated as appropriate:   Tobacco  Allergies  Meds  Problems  Med Hx  Surg Hx  Fam Hx     Review of Systems:  No other skin or systemic complaints except as noted in HPI or Assessment and Plan.  Objective  Well appearing patient in no apparent distress; mood and affect are within normal limits.  A focused examination was performed including the back and hands. Relevant physical exam findings are noted in the Assessment and Plan.  L hand dorsum x 1 Erythematous thin papules/macules with gritty scale.   L forearm x 1, R chest x 1, R shoulder x 1, back x 23 (26) Erythematous keratotic or waxy stuck-on papule or plaque.    Assessment & Plan  AK (actinic keratosis) L hand dorsum x 1  Destruction of lesion - L hand dorsum x 1 Complexity: simple   Destruction method: cryotherapy   Informed consent: discussed and consent obtained   Timeout:  patient name, date of birth, surgical site, and procedure verified Lesion destroyed using liquid nitrogen: Yes   Region frozen until ice ball extended beyond lesion: Yes   Outcome: patient tolerated procedure well with no complications   Post-procedure details: wound care instructions given    Inflamed seborrheic keratosis L forearm x 1, R chest x 1, R shoulder x 1, back x 23  Destruction of lesion - L forearm x 1, R chest x 1, R shoulder x 1, back x 23 Complexity: simple   Destruction method: cryotherapy   Informed consent: discussed and consent obtained   Timeout:  patient name, date of birth, surgical site, and procedure verified Lesion destroyed using liquid nitrogen:  Yes   Region frozen until ice ball extended beyond lesion: Yes   Outcome: patient tolerated procedure well with no complications   Post-procedure details: wound care instructions given    Actinic Damage - chronic, secondary to cumulative UV radiation exposure/sun exposure over time - diffuse scaly erythematous macules with underlying dyspigmentation - Recommend daily broad spectrum sunscreen SPF 30+ to sun-exposed areas, reapply every 2 hours as needed.  - Recommend staying in the shade or wearing long sleeves, sun glasses (UVA+UVB protection) and wide brim hats (4-inch brim around the entire circumference of the hat). - Call for new or changing lesions.  Seborrheic Keratoses - Stuck-on, waxy, tan-brown papules and/or plaques  - Benign-appearing - Discussed benign etiology and prognosis. - Observe - Call for any changes  Return in about 6 months (around 05/26/2021) for ISK and AK follow up .  Luther Redo, CMA, am acting as scribe for Sarina Ser, MD . Documentation: I have reviewed the above documentation for accuracy and completeness, and I agree with the above.  Sarina Ser, MD

## 2020-11-30 ENCOUNTER — Other Ambulatory Visit: Payer: Self-pay

## 2020-11-30 ENCOUNTER — Encounter: Payer: Self-pay | Admitting: Emergency Medicine

## 2020-11-30 ENCOUNTER — Emergency Department: Payer: Medicare Other

## 2020-11-30 ENCOUNTER — Inpatient Hospital Stay
Admission: EM | Admit: 2020-11-30 | Discharge: 2020-12-01 | DRG: 069 | Disposition: A | Discharge status: Home / Self Care | Payer: Medicare Other | Attending: Family Medicine | Admitting: Family Medicine

## 2020-11-30 DIAGNOSIS — Z993 Dependence on wheelchair: Secondary | ICD-10-CM

## 2020-11-30 DIAGNOSIS — Z85828 Personal history of other malignant neoplasm of skin: Secondary | ICD-10-CM

## 2020-11-30 DIAGNOSIS — Z91041 Radiographic dye allergy status: Secondary | ICD-10-CM

## 2020-11-30 DIAGNOSIS — Z20822 Contact with and (suspected) exposure to covid-19: Secondary | ICD-10-CM | POA: Diagnosis not present

## 2020-11-30 DIAGNOSIS — I161 Hypertensive emergency: Secondary | ICD-10-CM | POA: Diagnosis present

## 2020-11-30 DIAGNOSIS — E782 Mixed hyperlipidemia: Secondary | ICD-10-CM | POA: Diagnosis not present

## 2020-11-30 DIAGNOSIS — G7100 Muscular dystrophy, unspecified: Secondary | ICD-10-CM

## 2020-11-30 DIAGNOSIS — R4701 Aphasia: Secondary | ICD-10-CM | POA: Diagnosis present

## 2020-11-30 DIAGNOSIS — Z79899 Other long term (current) drug therapy: Secondary | ICD-10-CM

## 2020-11-30 DIAGNOSIS — Z90710 Acquired absence of both cervix and uterus: Secondary | ICD-10-CM | POA: Diagnosis not present

## 2020-11-30 DIAGNOSIS — F32A Depression, unspecified: Secondary | ICD-10-CM | POA: Diagnosis present

## 2020-11-30 DIAGNOSIS — I10 Essential (primary) hypertension: Secondary | ICD-10-CM

## 2020-11-30 DIAGNOSIS — I672 Cerebral atherosclerosis: Secondary | ICD-10-CM | POA: Diagnosis not present

## 2020-11-30 DIAGNOSIS — G459 Transient cerebral ischemic attack, unspecified: Secondary | ICD-10-CM | POA: Diagnosis not present

## 2020-11-30 DIAGNOSIS — Z79890 Hormone replacement therapy (postmenopausal): Secondary | ICD-10-CM

## 2020-11-30 DIAGNOSIS — K589 Irritable bowel syndrome without diarrhea: Secondary | ICD-10-CM | POA: Diagnosis not present

## 2020-11-30 DIAGNOSIS — Z974 Presence of external hearing-aid: Secondary | ICD-10-CM | POA: Diagnosis not present

## 2020-11-30 DIAGNOSIS — Z885 Allergy status to narcotic agent status: Secondary | ICD-10-CM

## 2020-11-30 DIAGNOSIS — M19041 Primary osteoarthritis, right hand: Secondary | ICD-10-CM | POA: Diagnosis present

## 2020-11-30 DIAGNOSIS — Z881 Allergy status to other antibiotic agents status: Secondary | ICD-10-CM

## 2020-11-30 DIAGNOSIS — M19042 Primary osteoarthritis, left hand: Secondary | ICD-10-CM | POA: Diagnosis not present

## 2020-11-30 DIAGNOSIS — Z7401 Bed confinement status: Secondary | ICD-10-CM | POA: Diagnosis not present

## 2020-11-30 DIAGNOSIS — I679 Cerebrovascular disease, unspecified: Secondary | ICD-10-CM

## 2020-11-30 LAB — CBC
HCT: 43.5 % (ref 36.0–46.0)
Hemoglobin: 14.8 g/dL (ref 12.0–15.0)
MCH: 31.7 pg (ref 26.0–34.0)
MCHC: 34 g/dL (ref 30.0–36.0)
MCV: 93.1 fL (ref 80.0–100.0)
Platelets: 272 10*3/uL (ref 150–400)
RBC: 4.67 MIL/uL (ref 3.87–5.11)
RDW: 13.9 % (ref 11.5–15.5)
WBC: 5.8 10*3/uL (ref 4.0–10.5)
nRBC: 0 % (ref 0.0–0.2)

## 2020-11-30 LAB — RESP PANEL BY RT-PCR (FLU A&B, COVID) ARPGX2
Influenza A by PCR: NEGATIVE
Influenza B by PCR: NEGATIVE
SARS Coronavirus 2 by RT PCR: NEGATIVE

## 2020-11-30 LAB — TROPONIN I (HIGH SENSITIVITY): Troponin I (High Sensitivity): 8 ng/L (ref <18)

## 2020-11-30 LAB — BASIC METABOLIC PANEL
Anion gap: 7 (ref 5–15)
BUN: 19 mg/dL (ref 8–23)
CO2: 30 mmol/L (ref 22–32)
Calcium: 9.6 mg/dL (ref 8.9–10.3)
Chloride: 98 mmol/L (ref 98–111)
Creatinine, Ser: <0.3 mg/dL — ABNORMAL LOW (ref 0.44–1.00)
Glucose, Bld: 108 mg/dL — ABNORMAL HIGH (ref 70–99)
Potassium: 4.1 mmol/L (ref 3.5–5.1)
Sodium: 135 mmol/L (ref 135–145)

## 2020-11-30 LAB — PROTIME-INR
INR: 0.9 (ref 0.8–1.2)
Prothrombin Time: 12.2 seconds (ref 11.4–15.2)

## 2020-11-30 LAB — APTT: aPTT: 34 seconds (ref 24–36)

## 2020-11-30 IMAGING — CT CT HEAD W/O CM
3 series · 16 of 47 positions shown, 19 images · non-contrast
Comparison: None.

CLINICAL DATA: Witnessed aphasia, now resolved

EXAM:
CT HEAD WITHOUT CONTRAST
TECHNIQUE: Contiguous axial images were obtained from the base of the skull
through the vertex without intravenous contrast.

[Series 2: head wo · axial · 0.44mm/px · z∈[+263,+388]mm · 10 of 31 slices shown, 13 images]
[im 3/31  brain]
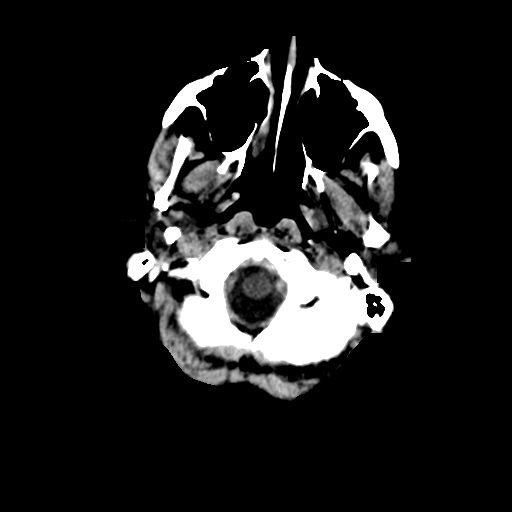
[im 3/31  bone]
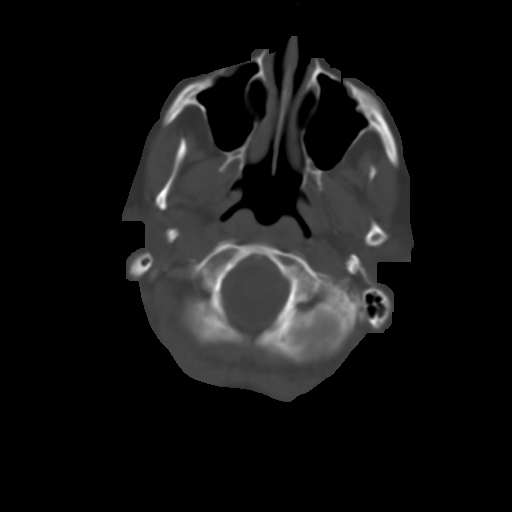
[im 6/31  brain]
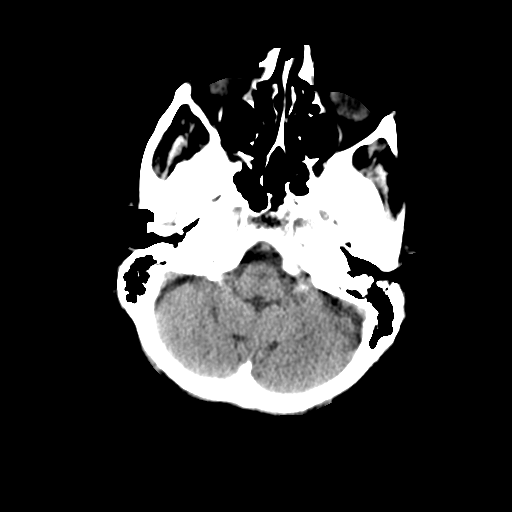
[im 9/31  brain]
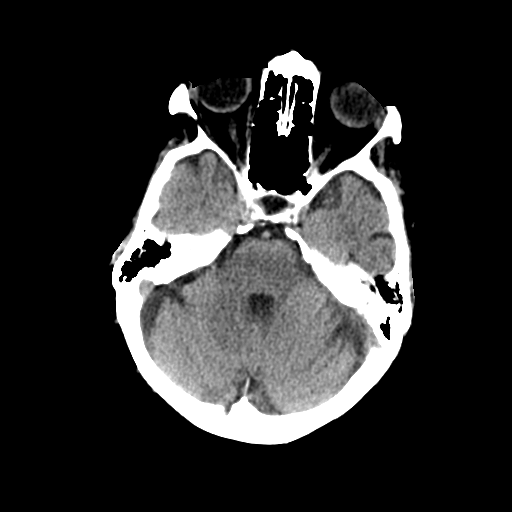
[im 11/31  brain]
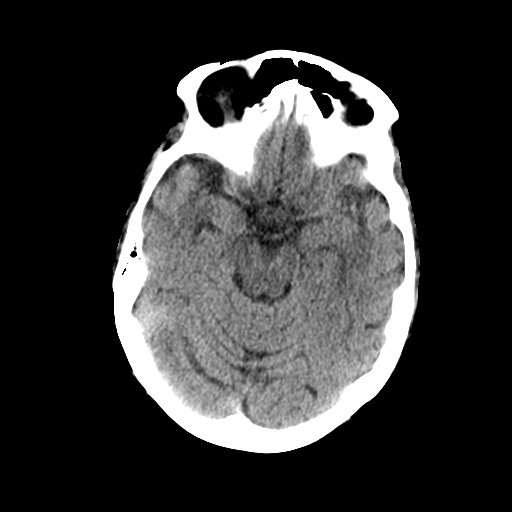
[im 14/31  brain]
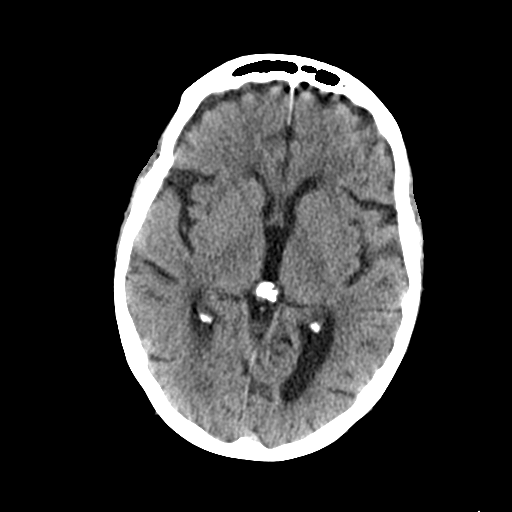
[im 14/31  bone]
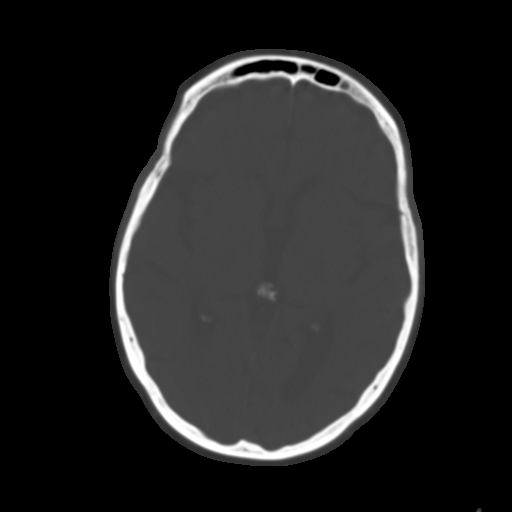
[im 17/31  brain]
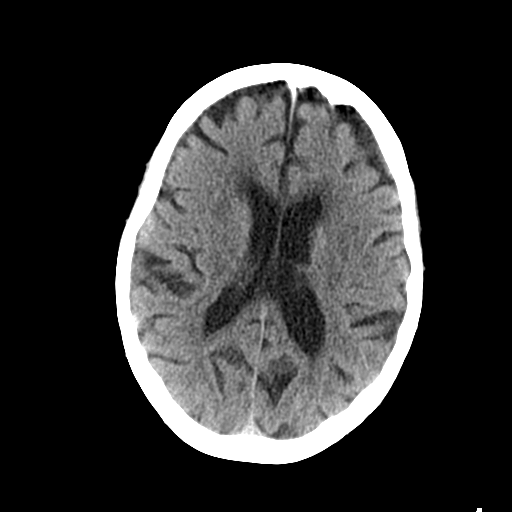
[im 20/31  brain]
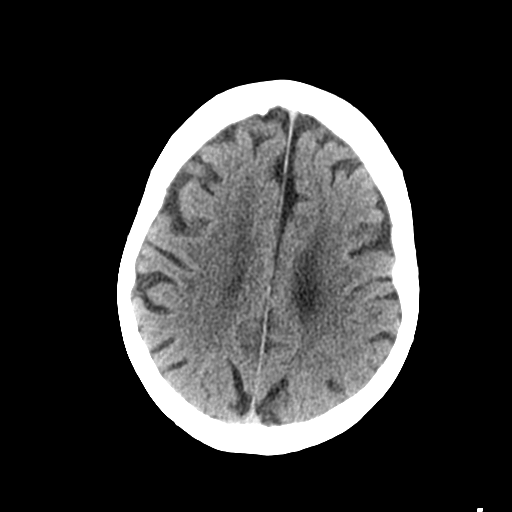
[im 23/31  brain]
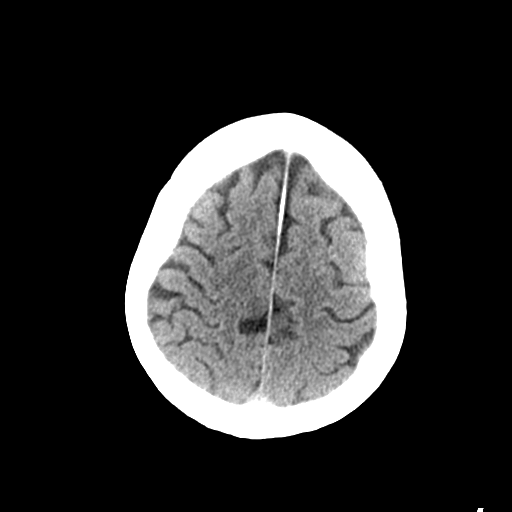
[im 25/31  brain]
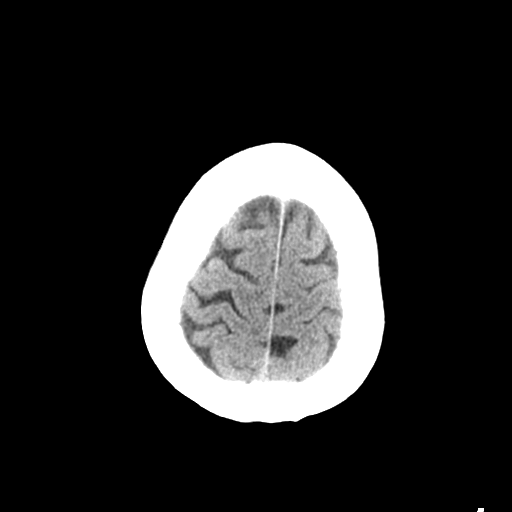
[im 25/31  bone]
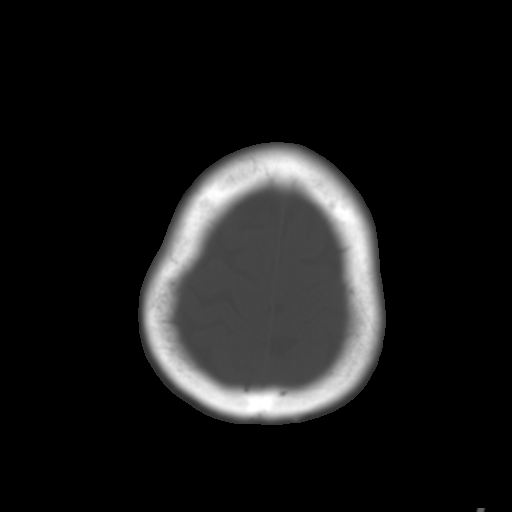
[im 28/31  brain]
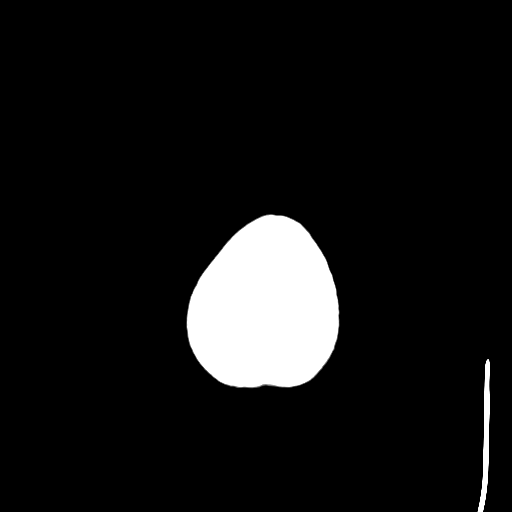

[Series 4: coronal soft tissue · coronal · 0.31mm/px · 3 of 68 slices shown]
[im 23/68  brain]
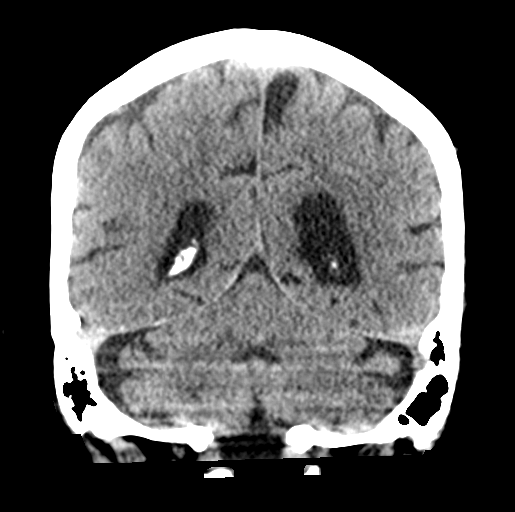
[im 30/68  brain]
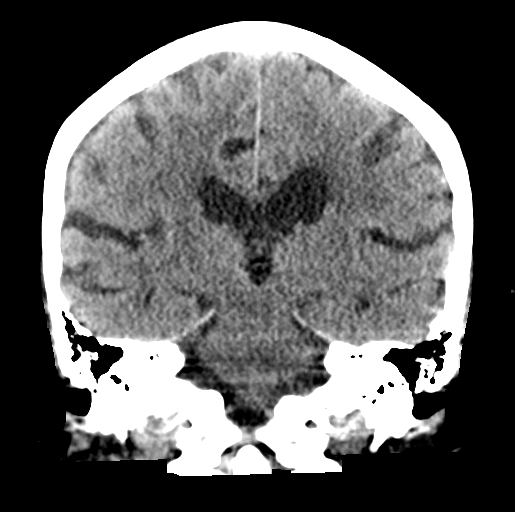
[im 38/68  brain]
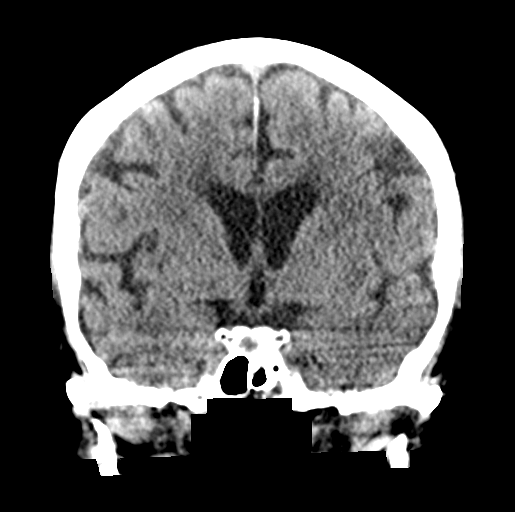

[Series 5: sagittal soft tissue · sagittal · 0.32mm/px · 3 of 50 slices shown]
[im 17/50  brain]
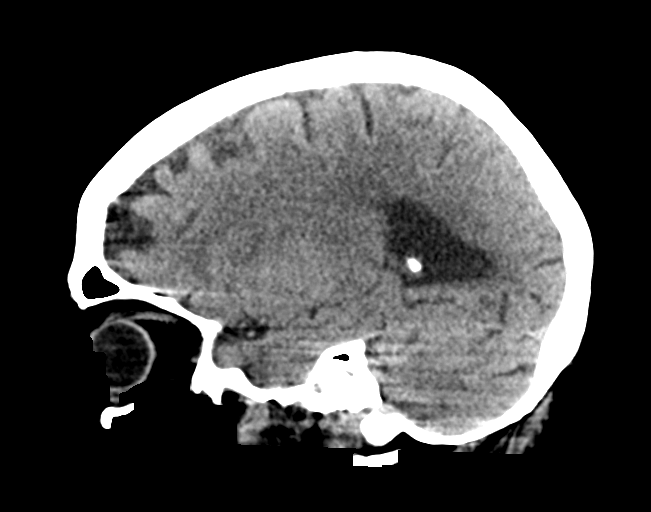
[im 25/50  brain]
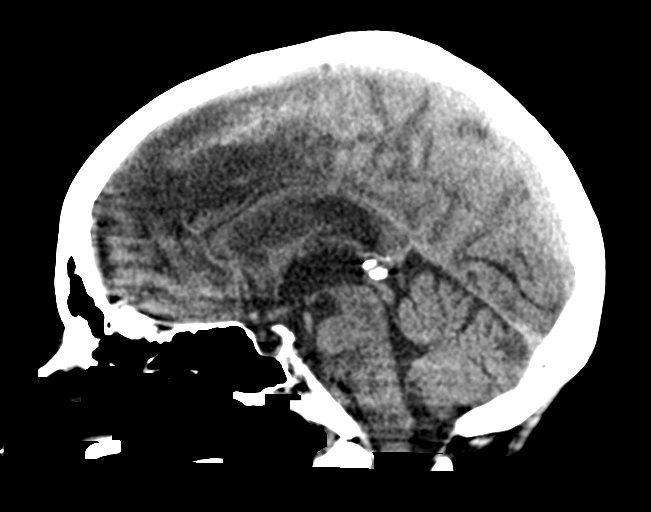
[im 33/50  brain]
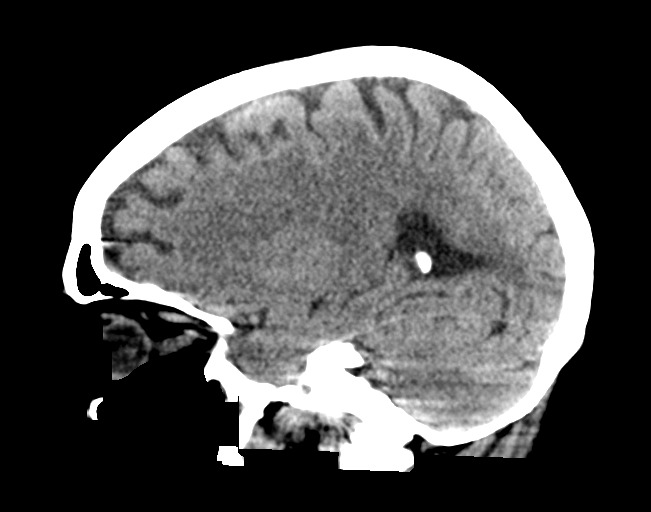

[16 of 47 positions shown; findings below may reference images not displayed]

FINDINGS: Brain: No evidence of acute infarction, hemorrhage, hydrocephalus,
extra-axial collection or mass lesion/mass effect.

Subcortical white matter and periventricular small vessel ischemic
changes.

Mild age related cortical atrophy.

Vascular: No hyperdense vessel or unexpected calcification.

Skull: Normal. Negative for fracture or focal lesion.

Sinuses/Orbits: The visualized paranasal sinuses are essentially
clear. The mastoid air cells are unopacified.

Other: None.
IMPRESSION: No evidence of acute intracranial abnormality. Atrophy with small
vessel ischemic changes.

## 2020-11-30 IMAGING — MR MR HEAD W/O CM
10 of 11 series · 30 of 48 positions shown · IV contrast (gadavist)
Comparison: No prior MRI, correlation is made with CT head
[DATE]

CLINICAL DATA: Neuro deficit, stroke suspected

EXAM:
MRI HEAD WITHOUT CONTRAST
MRA HEAD WITHOUT CONTRAST
MRA NECK WITHOUT AND WITH CONTRAST
TECHNIQUE: Multiplanar, multi-echo pulse sequences of the brain and surrounding
structures were acquired without intravenous contrast. Angiographic
images of the Circle of Willis were acquired using MRA technique
without intravenous contrast. Angiographic images of the neck were
acquired using MRA technique without and with intravenous contrast.
Carotid stenosis measurements (when applicable) are obtained
utilizing NASCET criteria, using the distal internal carotid
diameter as the denominator.
CONTRAST:  7mL GADAVIST GADOBUTROL 1 MMOL/ML IV SOLN

[Series 2: ax dwi_tracew · axial · 3.0mm · 1.31mm/px · z∈[-126,+24]mm · 5 of 48 slices shown]
[im 1/48]
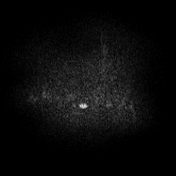
[im 12/48]
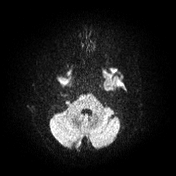
[im 24/48]
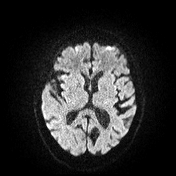
[im 36/48]
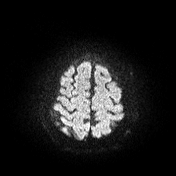
[im 48/48]
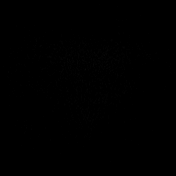

[Series 3: ax dwi_adc · axial · 3.0mm · 1.31mm/px · z∈[-126,+14]mm · 5 of 45 slices shown]
[im 1/45]
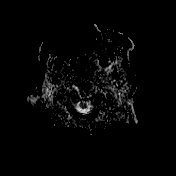
[im 12/45]
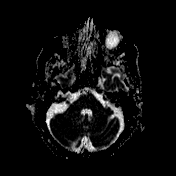
[im 23/45]
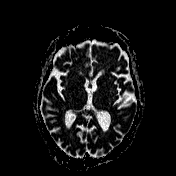
[im 34/45]
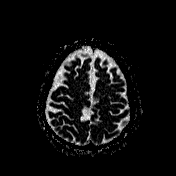
[im 45/45]
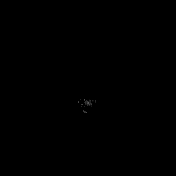

[Series 4: cor dwi_tracew · coronal · 5.0mm · 1.31mm/px · 4 of 38 slices shown]
[im 1/38]
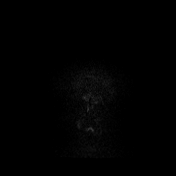
[im 13/38]
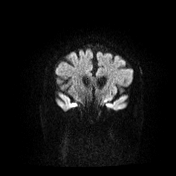
[im 25/38]
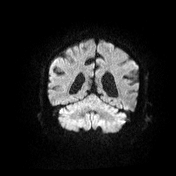
[im 38/38]
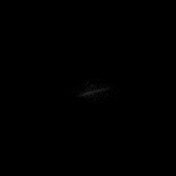

[Series 5: cor dwi_adc · coronal · 5.0mm · 1.31mm/px · 3 of 38 slices shown]
[im 1/38]
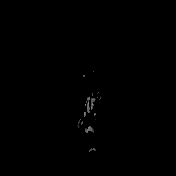
[im 19/38]
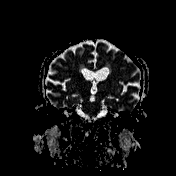
[im 38/38]
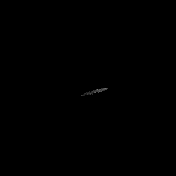

[Series 11: T1 · sagittal · 5.0mm · 0.94mm/px · 2 of 23 slices shown (1 of 2)]
[im 1/23]
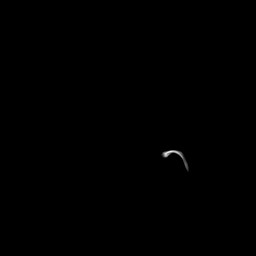
[im 23/23]
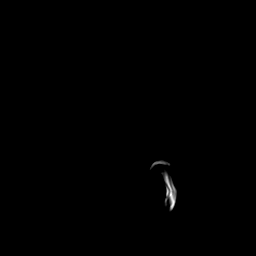

[Series 12: T2 · axial · 5.0mm · 0.45mm/px · z∈[-127,+24]mm · 2 of 27 slices shown (1 of 2)]
[im 1/27]
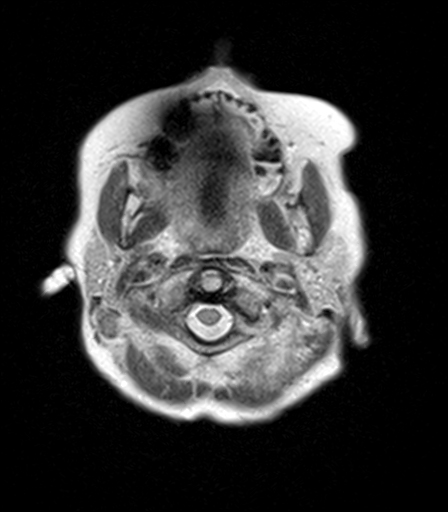
[im 27/27]
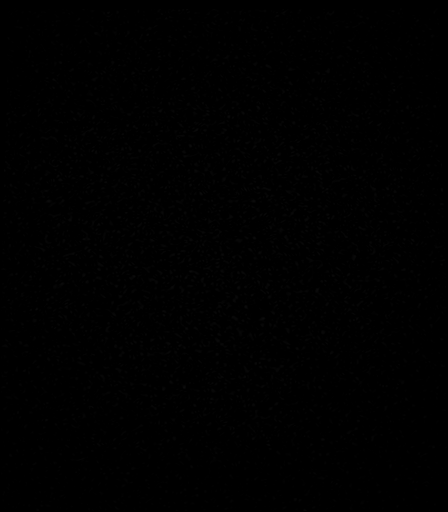

[Series 13: T2-star · axial · 5.0mm · 0.45mm/px · z∈[-127,+24]mm · 2 of 27 slices shown]
[im 1/27]
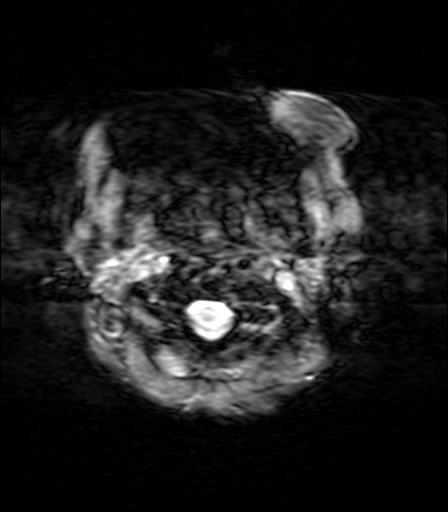
[im 27/27]
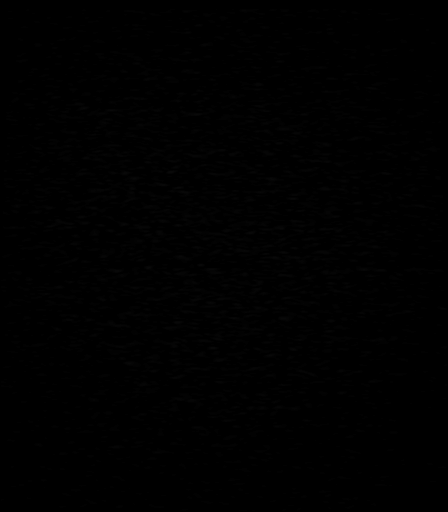

[Series 14: FLAIR · axial · 5.0mm · 1.20mm/px · z∈[-127,+24]mm · 2 of 27 slices shown]
[im 1/27]
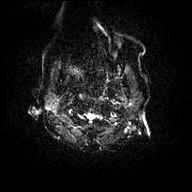
[im 27/27]
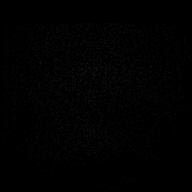

[Series 15: T1 · axial · 5.0mm · 0.90mm/px · z∈[-127,+18]mm · 2 of 26 slices shown (2 of 2)]
[im 1/26]
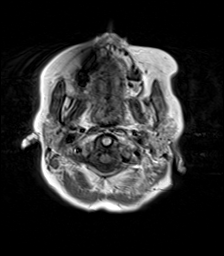
[im 26/26]
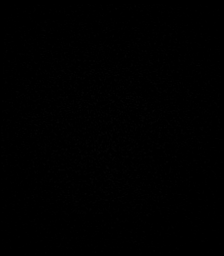

[Series 16: T2 · coronal · 5.0mm · 0.45mm/px · 3 of 31 slices shown (2 of 2)]
[im 1/31]
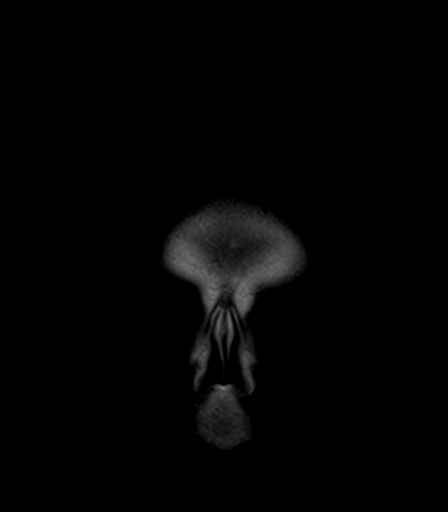
[im 16/31]
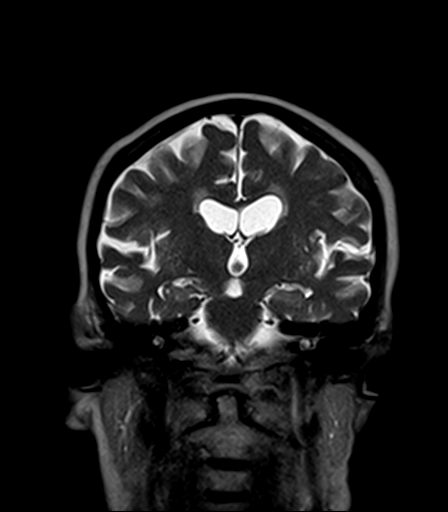
[im 31/31]
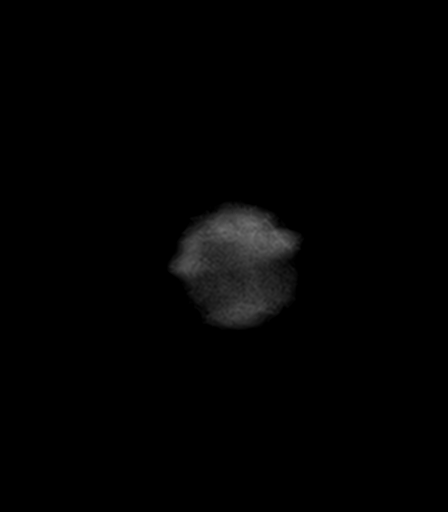

[30 of 48 positions shown; findings below may reference images not displayed]

FINDINGS: MRI HEAD FINDINGS

Brain: No acute infarction, hemorrhage, hydrocephalus, extra-axial
collection or mass lesion. No pathologic intracranial enhancement.
T2 hyperintense signal in the periventricular white matter, likely
the sequela of chronic small vessel ischemic disease.

Vascular: See vascular findings below.

Skull and upper cervical spine: Normal marrow signal.

Sinuses/Orbits: No acute or significant finding. Status post
bilateral lens replacements.

Other: The mastoids are well aerated.

MRA HEAD FINDINGS

Anterior circulation: Both internal carotid arteries are patent to
the termini, without stenosis or other abnormality. A1 segments
patent. Normal anterior communicating artery. Anterior cerebral
arteries are patent to their distal aspects. No M1 stenosis or
occlusion, although there is mild irregularity. Normal MCA
bifurcations. Distal MCA branches perfused, but asymmetric, with
decreased opacification of the left MCA branches. Possible focal
stenosis in the left insular segment (series 6, image 127).

Posterior circulation: Vertebral arteries widely patent to the
vertebrobasilar junction without stenosis. Posterior inferior
cerebral arteries patent bilaterally. Basilar patent to its distal
aspect. Superior cerebral arteries patent bilaterally. Focal
narrowing the bilateral P2 and P3 segments (series 6, images 119,
114, and 109). Fetal or near fetal origin of the right PCA. The left
posterior communicating artery is not definitively visualized.

Anatomic variants:

MRA NECK FINDINGS

Aortic arch: Standard branching. No significant stenosis or
dissection.

Right carotid system: No hemodynamically significant stenosis,
dissection, or occlusion.

Left carotid system: No hemodynamically significant stenosis,
dissection, or occlusion.

Vertebral arteries: Codominant. No significant stenosis or
occlusion.

Other: None
IMPRESSION: 1. No acute intracranial process.
2. No large vessel occlusion. The left distal MCA branches are less
perfused than the right MCA branches, with possible focal stenosis
in the left insular segment of the MCA.
3. Focal narrowing of the bilateral P2 and P3 segments.

## 2020-11-30 IMAGING — MR MR MRA NECK WO/W CM
2 of 3 series · 21 of 48 positions shown · IV contrast (7ml Gadavist)
Comparison: No prior MRI, correlation is made with CT head
[DATE]

CLINICAL DATA: Neuro deficit, stroke suspected

EXAM:
MRI HEAD WITHOUT CONTRAST
MRA HEAD WITHOUT CONTRAST
MRA NECK WITHOUT AND WITH CONTRAST
TECHNIQUE: Multiplanar, multi-echo pulse sequences of the brain and surrounding
structures were acquired without intravenous contrast. Angiographic
images of the Circle of Willis were acquired using MRA technique
without intravenous contrast. Angiographic images of the neck were
acquired using MRA technique without and with intravenous contrast.
Carotid stenosis measurements (when applicable) are obtained
utilizing NASCET criteria, using the distal internal carotid
diameter as the denominator.
CONTRAST:  7mL GADAVIST GADOBUTROL 1 MMOL/ML IV SOLN

[Series 29: angio_fl3d_cor_post_ttc=2.0s · coronal · 0.9mm · 0.85mm/px · 20 of 96 slices shown]
[im 1/96]
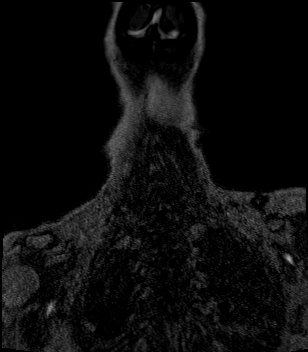
[im 6/96]
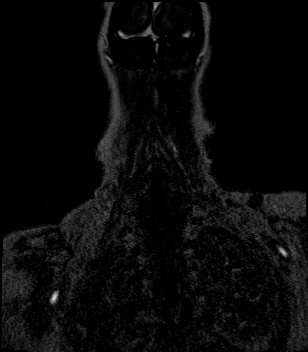
[im 11/96]
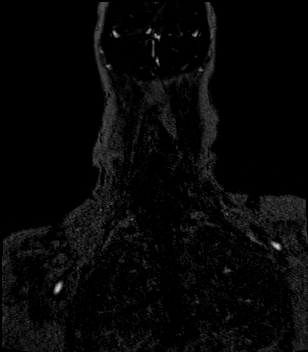
[im 16/96]
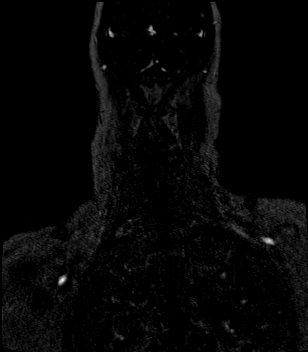
[im 21/96]
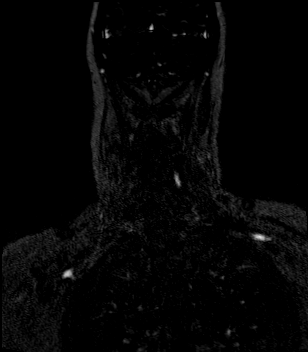
[im 26/96]
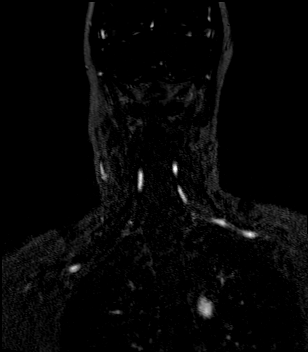
[im 31/96]
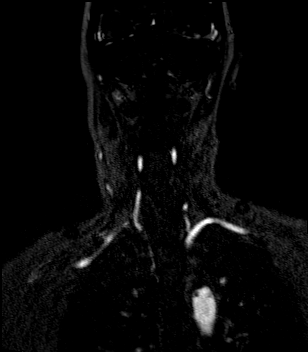
[im 36/96]
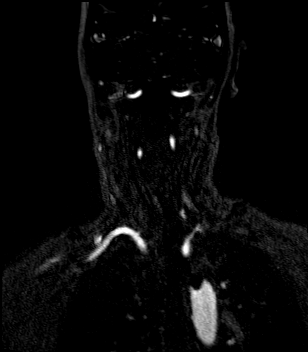
[im 41/96]
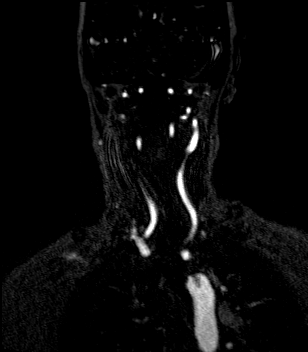
[im 46/96]
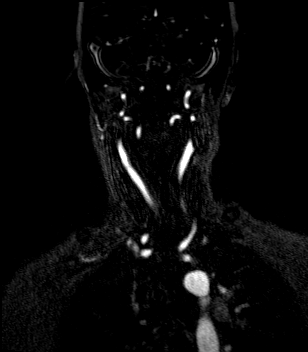
[im 51/96]
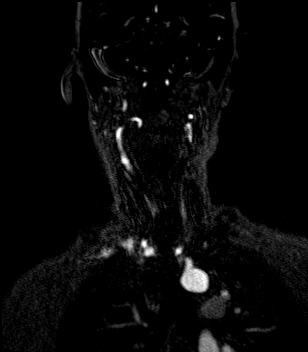
[im 56/96]
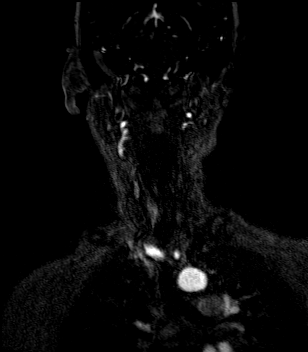
[im 61/96]
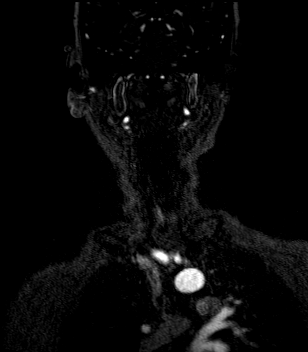
[im 66/96]
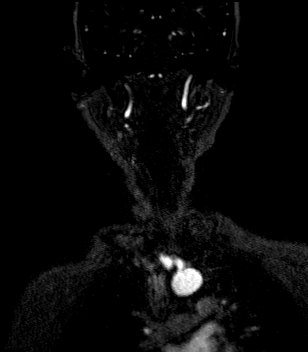
[im 71/96]
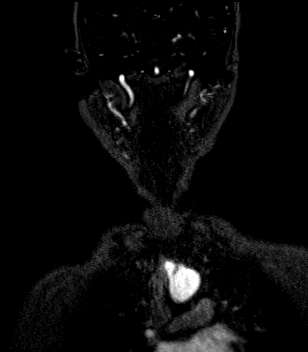
[im 76/96]
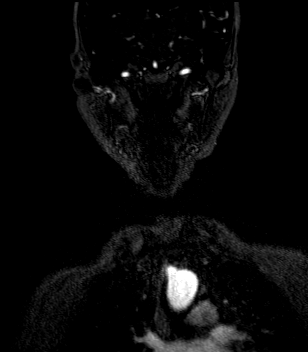
[im 81/96]
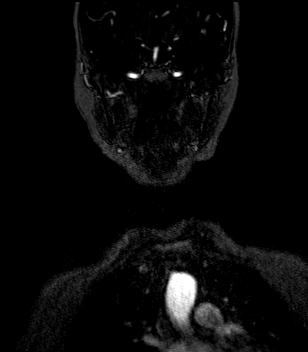
[im 86/96]
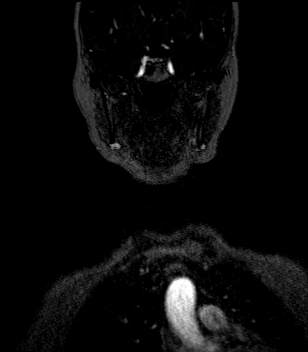
[im 91/96]
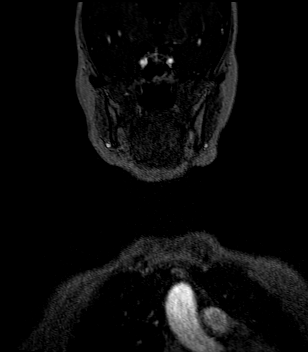
[im 96/96]
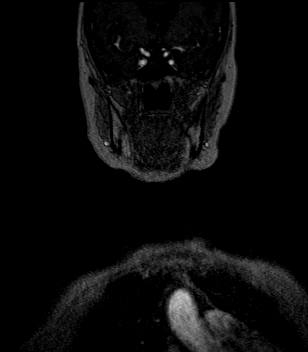

[Series 1148: bilat carotids · sagittal · 0.9mm · 0.43mm/px · 1 of 3 slices shown]
[im 1/3]
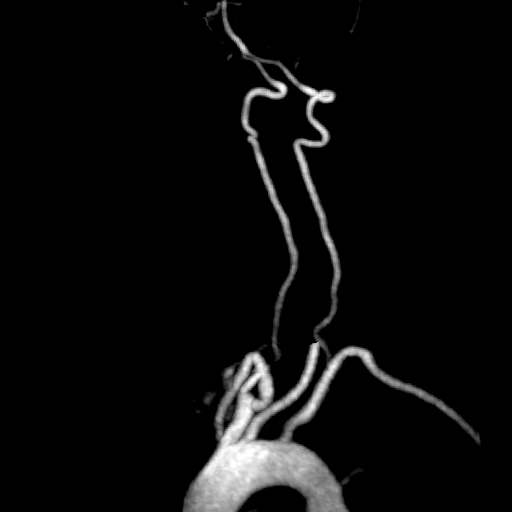

[21 of 48 positions shown; findings below may reference images not displayed]

FINDINGS: MRI HEAD FINDINGS

Brain: No acute infarction, hemorrhage, hydrocephalus, extra-axial
collection or mass lesion. No pathologic intracranial enhancement.
T2 hyperintense signal in the periventricular white matter, likely
the sequela of chronic small vessel ischemic disease.

Vascular: See vascular findings below.

Skull and upper cervical spine: Normal marrow signal.

Sinuses/Orbits: No acute or significant finding. Status post
bilateral lens replacements.

Other: The mastoids are well aerated.

MRA HEAD FINDINGS

Anterior circulation: Both internal carotid arteries are patent to
the termini, without stenosis or other abnormality. A1 segments
patent. Normal anterior communicating artery. Anterior cerebral
arteries are patent to their distal aspects. No M1 stenosis or
occlusion, although there is mild irregularity. Normal MCA
bifurcations. Distal MCA branches perfused, but asymmetric, with
decreased opacification of the left MCA branches. Possible focal
stenosis in the left insular segment (series 6, image 127).

Posterior circulation: Vertebral arteries widely patent to the
vertebrobasilar junction without stenosis. Posterior inferior
cerebral arteries patent bilaterally. Basilar patent to its distal
aspect. Superior cerebral arteries patent bilaterally. Focal
narrowing the bilateral P2 and P3 segments (series 6, images 119,
114, and 109). Fetal or near fetal origin of the right PCA. The left
posterior communicating artery is not definitively visualized.

Anatomic variants:

MRA NECK FINDINGS

Aortic arch: Standard branching. No significant stenosis or
dissection.

Right carotid system: No hemodynamically significant stenosis,
dissection, or occlusion.

Left carotid system: No hemodynamically significant stenosis,
dissection, or occlusion.

Vertebral arteries: Codominant. No significant stenosis or
occlusion.

Other: None
IMPRESSION: 1. No acute intracranial process.
2. No large vessel occlusion. The left distal MCA branches are less
perfused than the right MCA branches, with possible focal stenosis
in the left insular segment of the MCA.
3. Focal narrowing of the bilateral P2 and P3 segments.

## 2020-11-30 MED ORDER — ASPIRIN 81 MG PO CHEW
324.0000 mg | CHEWABLE_TABLET | Freq: Once | ORAL | Status: AC
  Administered 2020-11-30: 324 mg via ORAL
  Filled 2020-11-30: qty 4

## 2020-11-30 MED ORDER — LOSARTAN POTASSIUM 50 MG PO TABS
100.0000 mg | ORAL_TABLET | ORAL | Status: AC
  Administered 2020-11-30: 100 mg via ORAL
  Filled 2020-11-30: qty 2

## 2020-11-30 MED ORDER — CLOPIDOGREL BISULFATE 75 MG PO TABS
75.0000 mg | ORAL_TABLET | Freq: Once | ORAL | Status: AC
  Administered 2020-11-30: 75 mg via ORAL
  Filled 2020-11-30: qty 1

## 2020-11-30 MED ORDER — GADOBUTROL 1 MMOL/ML IV SOLN
7.0000 mL | Freq: Once | INTRAVENOUS | Status: AC | PRN
  Administered 2020-11-30: 7 mL via INTRAVENOUS

## 2020-11-30 NOTE — ED Provider Notes (Signed)
[facility] Emergency Department Provider Note   ____________________________________________   Event Date/Time   First MD Initiated Contact with Patient 11/30/20 1939     (approximate)  I have reviewed the triage vital signs and the nursing notes.   HISTORY  Chief Complaint Weakness (Came from home via EMS c/o weakness and now resolved aphasia)    HPI Abigail Cole is a 87 y.o. female who reports she was with her caretaker, at about 6:30 PM suddenly had difficulty speaking getting words out.  No other symptoms.  Denies weakness or numbness.  She has chronic weakness from muscular dystrophy extremely weak in her legs can wiggle her toes some on a good day, and barely able to lift her arms against gravity normally.  She denies being any pain or discomfort.  She is not sure what happened but could not speak words briefly.  By time EMS arrived she reports her speech is back to normal.  She feels normal now.  Does take medicine for blood pressure   Past Medical History:  Diagnosis Date   Arthritis    hands   Cough    when flat on back   Depression    Hypertension    Motion sickness    boats   Muscular dystrophy ([place])    uses wheel chair.  Non-weight bearing   Spastic colon    Squamous cell carcinoma of skin    L hand dorsum - treated years ago, no pathology available   Wears hearing aid     Patient Active Problem List   Diagnosis Date Noted   MD (muscular dystrophy) ([place]) 11/30/2020   Aphasia 11/30/2020   Hyperlipidemia, mixed 03/30/2018   Essential hypertension 02/25/2014    Past Surgical History:  Procedure Laterality Date   ABDOMINAL HYSTERECTOMY     CATARACT EXTRACTION W/PHACO Right 02/03/2016   Procedure: CATARACT EXTRACTION PHACO AND INTRAOCULAR LENS PLACEMENT ([place]);  Surgeon: Leandrew Koyanagi, MD;  Location: [place];  Service: Ophthalmology;  Laterality: Right;   CATARACT EXTRACTION W/PHACO Left 02/24/2016    Procedure: CATARACT EXTRACTION PHACO AND INTRAOCULAR LENS PLACEMENT ([facility]);  Surgeon: Leandrew Koyanagi, MD;  Location: [place];  Service: Ophthalmology;  Laterality: Left;  left eye IVA Topical    Prior to Admission medications   Medication Sig Start Date End Date Taking? Authorizing Provider  amitriptyline (ELAVIL) 10 MG tablet Take 10 mg by mouth as needed for sleep.    [provider]  Cholecalciferol (VITAMIN D PO) Take by mouth daily.    [provider]  estradiol (ESTRACE) 0.5 MG tablet Take 0.5 mg by mouth daily.    [provider]  fluticasone (FLONASE) 50 MCG/ACT nasal spray Place into both nostrils daily as needed for allergies or rhinitis.    [provider]  GuaiFENesin (MUCINEX PO) Take by mouth as needed.    [provider]  lisinopril (PRINIVIL,ZESTRIL) 20 MG tablet Take 20 mg by mouth daily.    [provider]  PARoxetine (PAXIL) 10 MG tablet Take 10 mg by mouth daily.    [provider]  Probiotic Product (PROBIOTIC PO) Take by mouth daily.    [provider]    Allergies Biaxin [clarithromycin], Codeine, and Contrast media [iodinated diagnostic agents]  History reviewed. No pertinent family history.  Social History Social History   Tobacco Use   Smoking status: Never   Smokeless tobacco: Never  Substance Use Topics   Alcohol use: No  Review of Systems Constitutional: No fever/chills or recent illness Eyes: No visual changes. ENT: No neck pain Cardiovascular: Denies chest pain. Respiratory: Denies shortness of breath. Gastrointestinal: No abdominal pain.   Musculoskeletal: Negative for back pain. Skin: Negative for rash. Neurological: Negative for headaches, areas of focal weakness or numbness.  See HPI    ____________________________________________   PHYSICAL EXAM:  VITAL SIGNS: ED Triage Vitals  Enc Vitals Group     BP 11/30/20 1944 (!) 183/134     Pulse  Rate 11/30/20 1944 83     Resp 11/30/20 1944 (!) 24     Temp 11/30/20 1944 98.1 F (36.7 C)     Temp Source 11/30/20 1944 Oral     SpO2 11/30/20 1944 100 %     Weight 11/30/20 1948 169 lb 15.6 oz (77.1 kg)     Height 11/30/20 1948 5\' 1"  (1.549 m)     Head Circumference --      Peak Flow --      Pain Score 11/30/20 1947 0     Pain Loc --      Pain Edu? --      Excl. in [place]? --     Constitutional: Alert and oriented. Well appearing and in no acute distress.  She is very pleasant.  She is somewhat tremulous in her extremities but reports that that is from her muscular dystrophy and is normal Eyes: Conjunctivae are normal. Head: Atraumatic. Nose: No congestion/rhinnorhea. Mouth/Throat: Mucous membranes are moist. Neck: No stridor.  Cardiovascular: Normal rate, regular rhythm. Grossly normal heart sounds.  Good peripheral circulation.  Barrel chested. Respiratory: Normal respiratory effort.  No retractions. Lungs CTAB. Gastrointestinal: Soft and nontender. No distention. Musculoskeletal: No lower extremity tenderness nor edema.  Able to move her arms 4 out of 5 strength arms bilateral, she reports this is normal she is barely able to lift against gravity perhaps 4 out of 5 strength.  Able to wiggle her toes of her feet but not lift her legs off the bed.  Again reporting this to be chronic Neurologic:  Normal speech and language though she is very soft-spoken but when she does speak if you listen carefully her words are clear she has some difficulty moving the jaw fully, but again reports this to be chronic. No gross focal neurologic deficits are appreciated as best I can compare to the patient's baseline.  Skin:  Skin is warm, dry and intact. No rash noted. Psychiatric: Mood and affect are normal. Speech and behavior are normal.  ____________________________________________   LABS (all labs ordered are listed, but only abnormal results are displayed)  Labs Reviewed  BASIC METABOLIC  PANEL - Abnormal; Notable for the following components:      Result Value   Glucose, Bld 108 (*)    Creatinine, Ser <0.30 (*)    All other components within normal limits  RESP PANEL BY RT-PCR (FLU A&B, COVID) ARPGX2  CBC  PROTIME-INR  APTT  URINALYSIS, COMPLETE (UACMP) WITH MICROSCOPIC  CBG MONITORING, ED  TROPONIN I (HIGH SENSITIVITY)   ____________________________________________  EKG  Reviewed by me at 1945 Heart rate 89 QRS 100 QTc 440 Normal sinus rhythm no evidence of acute ischemia ____________________________________________  RADIOLOGY  CT HEAD WO CONTRAST (5MM)  Result Date: 11/30/2020 CLINICAL DATA:  Witnessed aphasia, now resolved EXAM: CT HEAD WITHOUT CONTRAST TECHNIQUE: Contiguous axial images were obtained from the base of the skull through the vertex without intravenous contrast. COMPARISON:  None. FINDINGS: Brain: No evidence of acute  infarction, hemorrhage, hydrocephalus, extra-axial collection or mass lesion/mass effect. Subcortical white matter and periventricular small vessel ischemic changes. Mild age related cortical atrophy. Vascular: No hyperdense vessel or unexpected calcification. Skull: Normal. Negative for fracture or focal lesion. Sinuses/Orbits: The visualized paranasal sinuses are essentially clear. The mastoid air cells are unopacified. Other: None. IMPRESSION: No evidence of acute intracranial abnormality. Atrophy with small vessel ischemic changes. Electronically Signed   By: Julian Hy M.D.   On: 11/30/2020 20:16   MR ANGIO HEAD WO CONTRAST  Result Date: 11/30/2020 CLINICAL DATA:  Neuro deficit, stroke suspected EXAM: MRI HEAD WITHOUT CONTRAST MRA HEAD WITHOUT CONTRAST MRA NECK WITHOUT AND WITH CONTRAST TECHNIQUE: Multiplanar, multi-echo pulse sequences of the brain and surrounding structures were acquired without intravenous contrast. Angiographic images of the Circle of Willis were acquired using MRA technique without intravenous contrast.  Angiographic images of the neck were acquired using MRA technique without and with intravenous contrast. Carotid stenosis measurements (when applicable) are obtained utilizing NASCET criteria, using the distal internal carotid diameter as the denominator. CONTRAST:  56mL GADAVIST GADOBUTROL 1 MMOL/ML IV SOLN COMPARISON:  No prior MRI, correlation is made with CT head 11/30/2020 FINDINGS: MRI HEAD FINDINGS Brain: No acute infarction, hemorrhage, hydrocephalus, extra-axial collection or mass lesion. No pathologic intracranial enhancement. T2 hyperintense signal in the periventricular white matter, likely the sequela of chronic small vessel ischemic disease. Vascular: See vascular findings below. Skull and upper cervical spine: Normal marrow signal. Sinuses/Orbits: No acute or significant finding. Status post bilateral lens replacements. Other: The mastoids are well aerated. MRA HEAD FINDINGS Anterior circulation: Both internal carotid arteries are patent to the termini, without stenosis or other abnormality. A1 segments patent. Normal anterior communicating artery. Anterior cerebral arteries are patent to their distal aspects. No M1 stenosis or occlusion, although there is mild irregularity. Normal MCA bifurcations. Distal MCA branches perfused, but asymmetric, with decreased opacification of the left MCA branches. Possible focal stenosis in the left insular segment (series 6, image 127). Posterior circulation: Vertebral arteries widely patent to the vertebrobasilar junction without stenosis. Posterior inferior cerebral arteries patent bilaterally. Basilar patent to its distal aspect. Superior cerebral arteries patent bilaterally. Focal narrowing the bilateral P2 and P3 segments (series 6, images 119, 114, and 109). Fetal or near fetal origin of the right PCA. The left posterior communicating artery is not definitively visualized. Anatomic variants: MRA NECK FINDINGS Aortic arch: Standard branching. No significant  stenosis or dissection. Right carotid system: No hemodynamically significant stenosis, dissection, or occlusion. Left carotid system: No hemodynamically significant stenosis, dissection, or occlusion. Vertebral arteries: Codominant. No significant stenosis or occlusion. Other: None IMPRESSION: 1. No acute intracranial process. 2. No large vessel occlusion. The left distal MCA branches are less perfused than the right MCA branches, with possible focal stenosis in the left insular segment of the MCA. 3. Focal narrowing of the bilateral P2 and P3 segments. Electronically Signed   By: Merilyn Baba M.D.   On: 11/30/2020 22:14   MR Angiogram Neck W or Wo Contrast  Result Date: 11/30/2020 CLINICAL DATA:  Neuro deficit, stroke suspected EXAM: MRI HEAD WITHOUT CONTRAST MRA HEAD WITHOUT CONTRAST MRA NECK WITHOUT AND WITH CONTRAST TECHNIQUE: Multiplanar, multi-echo pulse sequences of the brain and surrounding structures were acquired without intravenous contrast. Angiographic images of the Circle of Willis were acquired using MRA technique without intravenous contrast. Angiographic images of the neck were acquired using MRA technique without and with intravenous contrast. Carotid stenosis measurements (when applicable) are obtained utilizing NASCET criteria, using  the distal internal carotid diameter as the denominator. CONTRAST:  25mL GADAVIST GADOBUTROL 1 MMOL/ML IV SOLN COMPARISON:  No prior MRI, correlation is made with CT head 11/30/2020 FINDINGS: MRI HEAD FINDINGS Brain: No acute infarction, hemorrhage, hydrocephalus, extra-axial collection or mass lesion. No pathologic intracranial enhancement. T2 hyperintense signal in the periventricular white matter, likely the sequela of chronic small vessel ischemic disease. Vascular: See vascular findings below. Skull and upper cervical spine: Normal marrow signal. Sinuses/Orbits: No acute or significant finding. Status post bilateral lens replacements. Other: The mastoids  are well aerated. MRA HEAD FINDINGS Anterior circulation: Both internal carotid arteries are patent to the termini, without stenosis or other abnormality. A1 segments patent. Normal anterior communicating artery. Anterior cerebral arteries are patent to their distal aspects. No M1 stenosis or occlusion, although there is mild irregularity. Normal MCA bifurcations. Distal MCA branches perfused, but asymmetric, with decreased opacification of the left MCA branches. Possible focal stenosis in the left insular segment (series 6, image 127). Posterior circulation: Vertebral arteries widely patent to the vertebrobasilar junction without stenosis. Posterior inferior cerebral arteries patent bilaterally. Basilar patent to its distal aspect. Superior cerebral arteries patent bilaterally. Focal narrowing the bilateral P2 and P3 segments (series 6, images 119, 114, and 109). Fetal or near fetal origin of the right PCA. The left posterior communicating artery is not definitively visualized. Anatomic variants: MRA NECK FINDINGS Aortic arch: Standard branching. No significant stenosis or dissection. Right carotid system: No hemodynamically significant stenosis, dissection, or occlusion. Left carotid system: No hemodynamically significant stenosis, dissection, or occlusion. Vertebral arteries: Codominant. No significant stenosis or occlusion. Other: None IMPRESSION: 1. No acute intracranial process. 2. No large vessel occlusion. The left distal MCA branches are less perfused than the right MCA branches, with possible focal stenosis in the left insular segment of the MCA. 3. Focal narrowing of the bilateral P2 and P3 segments. Electronically Signed   By: Merilyn Baba M.D.   On: 11/30/2020 22:14   MR BRAIN WO CONTRAST  Result Date: 11/30/2020 CLINICAL DATA:  Neuro deficit, stroke suspected EXAM: MRI HEAD WITHOUT CONTRAST MRA HEAD WITHOUT CONTRAST MRA NECK WITHOUT AND WITH CONTRAST TECHNIQUE: Multiplanar, multi-echo pulse  sequences of the brain and surrounding structures were acquired without intravenous contrast. Angiographic images of the Circle of Willis were acquired using MRA technique without intravenous contrast. Angiographic images of the neck were acquired using MRA technique without and with intravenous contrast. Carotid stenosis measurements (when applicable) are obtained utilizing NASCET criteria, using the distal internal carotid diameter as the denominator. CONTRAST:  45mL GADAVIST GADOBUTROL 1 MMOL/ML IV SOLN COMPARISON:  No prior MRI, correlation is made with CT head 11/30/2020 FINDINGS: MRI HEAD FINDINGS Brain: No acute infarction, hemorrhage, hydrocephalus, extra-axial collection or mass lesion. No pathologic intracranial enhancement. T2 hyperintense signal in the periventricular white matter, likely the sequela of chronic small vessel ischemic disease. Vascular: See vascular findings below. Skull and upper cervical spine: Normal marrow signal. Sinuses/Orbits: No acute or significant finding. Status post bilateral lens replacements. Other: The mastoids are well aerated. MRA HEAD FINDINGS Anterior circulation: Both internal carotid arteries are patent to the termini, without stenosis or other abnormality. A1 segments patent. Normal anterior communicating artery. Anterior cerebral arteries are patent to their distal aspects. No M1 stenosis or occlusion, although there is mild irregularity. Normal MCA bifurcations. Distal MCA branches perfused, but asymmetric, with decreased opacification of the left MCA branches. Possible focal stenosis in the left insular segment (series 6, image 127). Posterior circulation: Vertebral arteries widely  patent to the vertebrobasilar junction without stenosis. Posterior inferior cerebral arteries patent bilaterally. Basilar patent to its distal aspect. Superior cerebral arteries patent bilaterally. Focal narrowing the bilateral P2 and P3 segments (series 6, images 119, 114, and 109).  Fetal or near fetal origin of the right PCA. The left posterior communicating artery is not definitively visualized. Anatomic variants: MRA NECK FINDINGS Aortic arch: Standard branching. No significant stenosis or dissection. Right carotid system: No hemodynamically significant stenosis, dissection, or occlusion. Left carotid system: No hemodynamically significant stenosis, dissection, or occlusion. Vertebral arteries: Codominant. No significant stenosis or occlusion. Other: None IMPRESSION: 1. No acute intracranial process. 2. No large vessel occlusion. The left distal MCA branches are less perfused than the right MCA branches, with possible focal stenosis in the left insular segment of the MCA. 3. Focal narrowing of the bilateral P2 and P3 segments. Electronically Signed   By: Merilyn Baba M.D.   On: 11/30/2020 22:14    Imaging reviewed, discussed MRI results with Dr. Theda Sers of neurology.  Advises initiation of aspirin and 75 mg Plavix for intracranial stenosis.  Agrees with admission to [facility], with anticipated further consultation by neurology team tomorrow  ____________________________________________   PROCEDURES  Procedure(s) performed: None  Procedures  Critical Care performed: Yes, see critical care note(s)  CRITICAL CARE Performed by: Delman Kitten   Total critical care time: 25 minutes  Critical care time was exclusive of separately billable procedures and treating other patients.  Critical care was necessary to treat or prevent imminent or life-threatening deterioration.   INITIAL IMPRESSION / ASSESSMENT AND PLAN / ED COURSE  Pertinent labs & imaging results that were available during my care of the patient were reviewed by me and considered in my medical decision making (see chart for details).  Critical care was time spent personally by me on the following activities: development of treatment plan with patient and/or surrogate as well as nursing, discussions with  consultants, evaluation of patient's response to treatment, examination of patient, obtaining history from patient or surrogate, ordering and performing treatments and interventions, ordering and review of laboratory studies, ordering and review of radiographic studies, pulse oximetry and re-evaluation of patient's condition.  Patient presents acutely evaluated for concerns of a acute aphasia or dysarthria that has since resolved but is still within the stroke window.  Discussed case with neurologist, and activated code stroke cart.  After discussing case with neurologist Dr. Curly Shores recommends obtaining CT of the head, if negative initiate aspirin.  Also obtain stat MRI to evaluate for possible stroke.  After MRI we will have better idea as how best to manage patient's blood pressure.  Reassuring however at this point though the patient's neurologic deficits have resolved, we will continue to monitor and follow her blood pressure vital signs and other parameters neurologic exam closely.  Patient without evidence by clinical exam or history to suggest infectious.  EKG reassuring no chest pain.  ____________________________________________      Clinical Course as of 11/30/20 2347  Mon Nov 30, 2020  2123 Labs reviewed to this point, unremarkable.  CBC normal metabolic panel normal.  Patient had MRI evaluating for possible acute stroke [MQ]  2327 Discussed with Dr. Theda Sers (neuro). Advises ASA 325mg  Q day.  [MQ]    Clinical Course User Index [MQ] Delman Kitten, MD   Admission discussed with patient as well as her son at the bedside.  Both understanding agreeable plan for admission.  Blood pressure remains hypertensive, will give her home dose of  blood pressure medicine, initiate Plavix as well.  Admit for further care and management and anticipate neurology full consultation tomorrow   Patient awaiting admission to the hospital.  Ongoing care signed to Dr. Beather Arbour, plans discussed with hospitalist for  admission for further care and management of TIA and intracranial stenosis ____________________________________________   FINAL CLINICAL IMPRESSION(S) / ED DIAGNOSES  Final diagnoses:  Intracranial vascular stenosis  TIA (transient ischemic attack)  Hypertension, unspecified type        Note:  This document was prepared using Dragon voice recognition software and may include unintentional dictation errors       Delman Kitten, MD 11/30/20 2348

## 2020-11-30 NOTE — Plan of Care (Signed)
Patient discussed briefly with Dr. Delman Kitten.  She has a past medical history significant for muscular dystrophy (bedridden at baseline), hypertension, hyperlipidemia.  6:30 PM she had acute onset difficulty speaking to her caregiver but has now resolved back to her neurological baseline, though is quite hypertensive in the ED (blood pressure 183/134, significantly elevated from her baseline blood pressures in her office visits), and otherwise appears at her baseline without other acute concerns.  She additionally has an iodinated contrast allergy. Question is blood pressure management in this acute setting.   If Head CT negative for acute intracranial process, can start aspirin Recommend MRI brain without contrast, MRA head without contrast, MRA neck with and without contrast If negative for stroke, may treat as hypertensive emergency, if positive for acute stroke, please allow permissive hypertension to 220/110 for 24 to 48 hours Please hold estradiol given this is associated with significant stroke risk Neurology will plan to see in consultation tomorrow for stroke/TIA, please admit for standard workup  Lesleigh Noe MD-PhD [facility] [phone]  [facility] coverage for [facility] is from 8 AM to 4 AM in-house and 4 PM to 8 PM by telephone/video. 8 PM to 8 AM emergent questions or overnight urgent questions should be addressed to Teleneurology On-call or [facility] neurohospitalist; contact information can be found on AMION

## 2020-11-30 NOTE — ED Triage Notes (Signed)
Last known well was 1830. Witnessed aphasia by family.  EMS arrival was already resolved.

## 2020-11-30 NOTE — H&P (Signed)
History and Physical    [name] MVH:[id number] DOB: 12-Dec-1932 DOA: 11/30/2020  PCP: Rusty Aus, MD    Patient coming from:  Home    Chief Complaint:  Aphasia.   HPI: Abigail Cole is a 87 y.o. female seen in ed with complaints of aphasia  about 6:30 pm. Pt has chronic weakness from her muscular dystrophy in her legs. Pt's aphasia has resolved and code stroke was activated with neurology consult. Initial head ct is negative and pt was given aspirin. Caretaker on phone ms neta describes the event as pt having speech difficulty while on commode. Pt was trying to say something about her friend Belenda Cruise and was not able to say the words and kept repeating katherine. According toms neta this was few minutes.  They then called 911 and was brought here. Pt was aware that she was repeating katherine but not how long this lasted.son at bedside. ROS is negative per pt. Ms neta also reports that pt's BP during this was 841'L systolic. No bedsores.  Pt has past medical history of arthritis, depression, hypertension, muscular dystrophy. Patient also has allergies to Biaxin, codeine, contrast.  ED Course:  Vitals:   11/30/20 2200 11/30/20 2230 11/30/20 2300 11/30/20 2330  BP: (!) 178/75 (!) 173/73 (!) 179/79 (!) 177/65  Pulse: 97 79 94 83  Resp: 13 (!) 22 (!) 21 20  Temp:      TempSrc:      SpO2: 98% 99% 100% 98%  Weight:      Height:      In the emergency room patient is alert awake oriented afebrile hypertensive.  Initial EKG shows sinus rhythm 89, QTC of 440, no ST-T wave changes. Moderate showers glucose of 108, normal kidney function, normal electrolytes, normal troponin of 8, seizures normal white count of 5.8, hemoglobin of 14.8 and platelets of 272. Respiratory panel negative for flu and COVID.  MRI of the head negative for any acute findings.  Chart reviewed neurology note reviewed.  Patient given aspirin 324 followed by losartan 100 mg for her hypertension.  Review of  Systems:  Review of Systems  Constitutional: Negative.   HENT: Negative.    Eyes: Negative.   Respiratory: Negative.    Cardiovascular: Negative.   Gastrointestinal: Negative.   Musculoskeletal: Negative.   Skin: Negative.   Neurological:  Positive for speech change.  Psychiatric/Behavioral: Negative.      Past Medical History:  Diagnosis Date   Arthritis    hands   Cough    when flat on back   Depression    Hypertension    Motion sickness    boats   Muscular dystrophy ([place])    uses wheel chair.  Non-weight bearing   Spastic colon    Squamous cell carcinoma of skin    L hand dorsum - treated years ago, no pathology available   Wears hearing aid     Past Surgical History:  Procedure Laterality Date   ABDOMINAL HYSTERECTOMY     CATARACT EXTRACTION W/PHACO Right 02/03/2016   Procedure: CATARACT EXTRACTION PHACO AND INTRAOCULAR LENS PLACEMENT (IOC);  Surgeon: Leandrew Koyanagi, MD;  Location: [facility];  Service: Ophthalmology;  Laterality: Right;   CATARACT EXTRACTION W/PHACO Left 02/24/2016   Procedure: CATARACT EXTRACTION PHACO AND INTRAOCULAR LENS PLACEMENT (IOC);  Surgeon: Leandrew Koyanagi, MD;  Location: [facility];  Service: Ophthalmology;  Laterality: Left;  left eye IVA Topical     reports that she has never smoked. She has  never used smokeless tobacco. She reports that she does not drink alcohol and does not use drugs.  Allergies  Allergen Reactions   Biaxin [Clarithromycin] Nausea And Vomiting   Codeine Nausea And Vomiting   Contrast Media [Iodinated Diagnostic Agents] Swelling    Did Ok with pre-medication    Family History  Problem Relation Age of Onset   Muscular dystrophy Mother    Muscular dystrophy Father     Prior to Admission medications   Medication Sig Start Date End Date Taking? Authorizing Provider  amitriptyline (ELAVIL) 10 MG tablet Take 10 mg by mouth as needed for sleep.    [provider]   Cholecalciferol (VITAMIN D PO) Take by mouth daily.    [provider]  estradiol (ESTRACE) 0.5 MG tablet Take 0.5 mg by mouth daily.    [provider]  fluticasone (FLONASE) 50 MCG/ACT nasal spray Place into both nostrils daily as needed for allergies or rhinitis.    [provider]  GuaiFENesin (MUCINEX PO) Take by mouth as needed.    [provider]  lisinopril (PRINIVIL,ZESTRIL) 20 MG tablet Take 20 mg by mouth daily.    [provider]  PARoxetine (PAXIL) 10 MG tablet Take 10 mg by mouth daily.    [provider]  Probiotic Product (PROBIOTIC PO) Take by mouth daily.    [provider]    Physical Exam: Vitals:   11/30/20 2200 11/30/20 2230 11/30/20 2300 11/30/20 2330  BP: (!) 178/75 (!) 173/73 (!) 179/79 (!) 177/65  Pulse: 97 79 94 83  Resp: 13 (!) 22 (!) 21 20  Temp:      TempSrc:      SpO2: 98% 99% 100% 98%  Weight:      Height:       Physical Exam Vitals and nursing note reviewed.  Constitutional:      General: She is not in acute distress.    Appearance: She is not ill-appearing, toxic-appearing or diaphoretic.     Comments: Pt at baseline is unable to close her eyes from her lower lids, lower jaw is weak and cannot close her mouth, cannot smile,both her forearm at extended and wrists are flexed and she has 3/5 strength , but unable to flex or extend arm and forearm. Her legs are mobile but weak at 2/5 strength.    HENT:     Head: Normocephalic and atraumatic.     Right Ear: External ear normal.     Left Ear: External ear normal.     Nose: Nose normal.     Mouth/Throat:     Mouth: Mucous membranes are moist.  Eyes:     Extraocular Movements: Extraocular movements intact.     Pupils: Pupils are equal, round, and reactive to light.  Neck:     Vascular: No carotid bruit.  Cardiovascular:     Rate and Rhythm: Normal rate and regular rhythm.     Pulses: Normal pulses.     Heart sounds: Normal heart  sounds.  Pulmonary:     Effort: Pulmonary effort is normal.     Breath sounds: Normal breath sounds.  Abdominal:     General: Bowel sounds are normal.     Palpations: Abdomen is soft.  Musculoskeletal:     Cervical back: No rigidity or tenderness.     Right lower leg: No edema.     Left lower leg: No edema.  Lymphadenopathy:     Cervical: No cervical adenopathy.  Skin:  General: Skin is warm.  Neurological:     General: No focal deficit present.     Mental Status: She is alert and oriented to person, place, and time.     Cranial Nerves: No dysarthria or facial asymmetry.     Motor: Weakness, atrophy and abnormal muscle tone present.     Deep Tendon Reflexes:     Reflex Scores:      Bicep reflexes are 2+ on the right side and 2+ on the left side.      Patellar reflexes are 1+ on the right side and 1+ on the left side. Psychiatric:        Attention and Perception: Attention normal.        Mood and Affect: Mood and affect normal.        Speech: Speech normal.        Behavior: Behavior normal. Behavior is cooperative.        Thought Content: Thought content normal.        Cognition and Memory: Cognition normal.        Judgment: Judgment normal.   Labs on Admission: I have personally reviewed following labs and imaging studies  No results for input(s): CKTOTAL, CKMB, TROPONINI in the last 72 hours. Lab Results  Component Value Date   WBC 5.8 11/30/2020   HGB 14.8 11/30/2020   HCT 43.5 11/30/2020   MCV 93.1 11/30/2020   PLT 272 11/30/2020    Recent Labs  Lab 11/30/20 2001  NA 135  K 4.1  CL 98  CO2 30  BUN 19  CREATININE <0.30*  CALCIUM 9.6  GLUCOSE 108*    COVID-19 Labs No results for input(s): DDIMER, FERRITIN, LDH, CRP in the last 72 hours. Lab Results  Component Value Date   [place] NEGATIVE 11/30/2020    Radiological Exams on Admission: CT HEAD WO CONTRAST (5MM) Result Date: 11/30/2020 CLINICAL DATA:  Witnessed aphasia, now resolved EXAM: CT  HEAD WITHOUT CONTRAST TECHNIQUE: Contiguous axial images were obtained from the base of the skull through the vertex without intravenous contrast. COMPARISON:  None. FINDINGS: Brain: No evidence of acute infarction, hemorrhage, hydrocephalus, extra-axial collection or mass lesion/mass effect. Subcortical white matter and periventricular small vessel ischemic changes. Mild age related cortical atrophy. Vascular: No hyperdense vessel or unexpected calcification. Skull: Normal. Negative for fracture or focal lesion. Sinuses/Orbits: The visualized paranasal sinuses are essentially clear. The mastoid air cells are unopacified. Other: None.  IMPRESSION:  No evidence of acute intracranial abnormality. Atrophy with small vessel ischemic changes.  Electronically Signed   By: Julian Hy M.D.   On: 11/30/2020 20:16   MR ANGIO HEAD WO CONTRAST Result Date: 11/30/2020 CLINICAL DATA:  Neuro deficit, stroke suspected EXAM: MRI HEAD WITHOUT CONTRAST MRA HEAD WITHOUT CONTRAST MRA NECK WITHOUT AND WITH CONTRAST TECHNIQUE: Multiplanar, multi-echo pulse sequences of the brain and surrounding structures were acquired without intravenous contrast. Angiographic images of the Circle of Willis were acquired using MRA technique without intravenous contrast. Angiographic images of the neck were acquired using MRA technique without and with intravenous contrast. Carotid stenosis measurements (when applicable) are obtained utilizing NASCET criteria, using the distal internal carotid diameter as the denominator. CONTRAST:  32mL GADAVIST GADOBUTROL 1 MMOL/ML IV SOLN COMPARISON:  No prior MRI, correlation is made with CT head 11/30/2020 FINDINGS: MRI HEAD FINDINGS Brain: No acute infarction, hemorrhage, hydrocephalus, extra-axial collection or mass lesion. No pathologic intracranial enhancement. T2 hyperintense signal in the periventricular white matter, likely the sequela of chronic small vessel  ischemic disease. Vascular: See  vascular findings below. Skull and upper cervical spine: Normal marrow signal. Sinuses/Orbits: No acute or significant finding. Status post bilateral lens replacements. Other: The mastoids are well aerated. MRA HEAD FINDINGS Anterior circulation: Both internal carotid arteries are patent to the termini, without stenosis or other abnormality. A1 segments patent. Normal anterior communicating artery. Anterior cerebral arteries are patent to their distal aspects. No M1 stenosis or occlusion, although there is mild irregularity. Normal MCA bifurcations. Distal MCA branches perfused, but asymmetric, with decreased opacification of the left MCA branches. Possible focal stenosis in the left insular segment (series 6, image 127). Posterior circulation: Vertebral arteries widely patent to the vertebrobasilar junction without stenosis. Posterior inferior cerebral arteries patent bilaterally. Basilar patent to its distal aspect. Superior cerebral arteries patent bilaterally. Focal narrowing the bilateral P2 and P3 segments (series 6, images 119, 114, and 109). Fetal or near fetal origin of the right PCA. The left posterior communicating artery is not definitively visualized. Anatomic variants: MRA NECK FINDINGS Aortic arch: Standard branching. No significant stenosis or dissection. Right carotid system: No hemodynamically significant stenosis, dissection, or occlusion. Left carotid system: No hemodynamically significant stenosis, dissection, or occlusion. Vertebral arteries: Codominant. No significant stenosis or occlusion. Other: None IMPRESSION:  1. No acute intracranial process.  2. No large vessel occlusion. The left distal MCA branches are less perfused than the right MCA branches, with possible focal stenosis in the left insular segment of the MCA.  3. Focal narrowing of the bilateral P2 and P3 segments.  Electronically Signed   By: Merilyn Baba M.D.   On: 11/30/2020 22:14   MR Angiogram Neck W or Wo  Contrast Result Date: 11/30/2020 CLINICAL DATA:  Neuro deficit, stroke suspected EXAM: MRI HEAD WITHOUT CONTRAST MRA HEAD WITHOUT CONTRAST MRA NECK WITHOUT AND WITH CONTRAST TECHNIQUE: Multiplanar, multi-echo pulse sequences of the brain and surrounding structures were acquired without intravenous contrast. Angiographic images of the Circle of Willis were acquired using MRA technique without intravenous contrast. Angiographic images of the neck were acquired using MRA technique without and with intravenous contrast. Carotid stenosis measurements (when applicable) are obtained utilizing NASCET criteria, using the distal internal carotid diameter as the denominator. CONTRAST:  44mL GADAVIST GADOBUTROL 1 MMOL/ML IV SOLN COMPARISON:  No prior MRI, correlation is made with CT head 11/30/2020 FINDINGS: MRI HEAD FINDINGS Brain: No acute infarction, hemorrhage, hydrocephalus, extra-axial collection or mass lesion. No pathologic intracranial enhancement. T2 hyperintense signal in the periventricular white matter, likely the sequela of chronic small vessel ischemic disease. Vascular: See vascular findings below. Skull and upper cervical spine: Normal marrow signal. Sinuses/Orbits: No acute or significant finding. Status post bilateral lens replacements. Other: The mastoids are well aerated. MRA HEAD FINDINGS Anterior circulation: Both internal carotid arteries are patent to the termini, without stenosis or other abnormality. A1 segments patent. Normal anterior communicating artery. Anterior cerebral arteries are patent to their distal aspects. No M1 stenosis or occlusion, although there is mild irregularity. Normal MCA bifurcations. Distal MCA branches perfused, but asymmetric, with decreased opacification of the left MCA branches. Possible focal stenosis in the left insular segment (series 6, image 127). Posterior circulation: Vertebral arteries widely patent to the vertebrobasilar junction without stenosis. Posterior  inferior cerebral arteries patent bilaterally. Basilar patent to its distal aspect. Superior cerebral arteries patent bilaterally. Focal narrowing the bilateral P2 and P3 segments (series 6, images 119, 114, and 109). Fetal or near fetal origin of the right PCA. The left posterior communicating artery is not definitively  visualized. Anatomic variants: MRA NECK FINDINGS Aortic arch: Standard branching. No significant stenosis or dissection. Right carotid system: No hemodynamically significant stenosis, dissection, or occlusion. Left carotid system: No hemodynamically significant stenosis, dissection, or occlusion. Vertebral arteries: Codominant. No significant stenosis or occlusion. Other: None IMPRESSION:  1. No acute intracranial process. 2. No large vessel occlusion. The left distal MCA branches are less perfused than the right MCA branches, with possible focal stenosis in the left insular segment of the MCA. 3. Focal narrowing of the bilateral P2 and P3 segments.  Electronically Signed   By: Merilyn Baba M.D.   On: 11/30/2020 22:14   MR BRAIN WO CONTRAST Result Date: 11/30/2020 CLINICAL DATA:  Neuro deficit, stroke suspected EXAM: MRI HEAD WITHOUT CONTRAST MRA HEAD WITHOUT CONTRAST MRA NECK WITHOUT AND WITH CONTRAST TECHNIQUE: Multiplanar, multi-echo pulse sequences of the brain and surrounding structures were acquired without intravenous contrast. Angiographic images of the Circle of Willis were acquired using MRA technique without intravenous contrast. Angiographic images of the neck were acquired using MRA technique without and with intravenous contrast. Carotid stenosis measurements (when applicable) are obtained utilizing NASCET criteria, using the distal internal carotid diameter as the denominator. CONTRAST:  56mL GADAVIST GADOBUTROL 1 MMOL/ML IV SOLN COMPARISON:  No prior MRI, correlation is made with CT head 11/30/2020 FINDINGS: MRI HEAD FINDINGS Brain: No acute infarction, hemorrhage,  hydrocephalus, extra-axial collection or mass lesion. No pathologic intracranial enhancement. T2 hyperintense signal in the periventricular white matter, likely the sequela of chronic small vessel ischemic disease. Vascular: See vascular findings below. Skull and upper cervical spine: Normal marrow signal. Sinuses/Orbits: No acute or significant finding. Status post bilateral lens replacements. Other: The mastoids are well aerated. MRA HEAD FINDINGS Anterior circulation: Both internal carotid arteries are patent to the termini, without stenosis or other abnormality. A1 segments patent. Normal anterior communicating artery. Anterior cerebral arteries are patent to their distal aspects. No M1 stenosis or occlusion, although there is mild irregularity. Normal MCA bifurcations. Distal MCA branches perfused, but asymmetric, with decreased opacification of the left MCA branches. Possible focal stenosis in the left insular segment (series 6, image 127). Posterior circulation: Vertebral arteries widely patent to the vertebrobasilar junction without stenosis. Posterior inferior cerebral arteries patent bilaterally. Basilar patent to its distal aspect. Superior cerebral arteries patent bilaterally. Focal narrowing the bilateral P2 and P3 segments (series 6, images 119, 114, and 109). Fetal or near fetal origin of the right PCA. The left posterior communicating artery is not definitively visualized. Anatomic variants: MRA NECK FINDINGS Aortic arch: Standard branching. No significant stenosis or dissection. Right carotid system: No hemodynamically significant stenosis, dissection, or occlusion. Left carotid system: No hemodynamically significant stenosis, dissection, or occlusion. Vertebral arteries: Codominant. No significant stenosis or occlusion. Other: None IMPRESSION:  1. No acute intracranial process.  2. No large vessel occlusion. The left distal MCA branches are less perfused than the right MCA branches, with possible  focal stenosis in the left insular segment of the MCA.  3. Focal narrowing of the bilateral P2 and P3 segments.   Electronically Signed   By: Merilyn Baba M.D.   On: 11/30/2020 22:14    EKG: Independently reviewed.  EKG shows sinus rhythm 89, QTC of 440, no ST-T wave changes.   Assessment/Plan Principal Problem:   Aphasia Active Problems:   MD (muscular dystrophy) ([place])   Essential hypertension   Hyperlipidemia, mixed   Aphasia: ST consult in am.  Bedside swallow.  Resolved.  We will admit to med surg with  cva admit protocol. Speech and PT consult.    Muscular dystrophy: Abnormality at baseline noted in PE.  Baseline per pt and PT consult.    HTN: Blood pressure (!) 177/65, pulse 83, temperature 98.1 F (36.7 C), temperature source Oral, resp. rate 20, height 5\' 1"  (1.549 m), weight 77.1 kg, SpO2 98 %. Resumed home meds for BP as she has negative MRI for acute cva.   Hyperlipidemia: Cont statin therapy.    DVT prophylaxis:  SCD's   Code Status:  Full code    Family Communication:  Shronda, Boeh (Son)  [phone] (Home Phone)   Disposition Plan:  TBD   Consults called:  Neurology   Admission status: Inpatient.     Para Skeans MD [facility] [phone] How to contact the [facility] Attending or Consulting provider [place] or covering provider during after hours [place], for this patient.    Check the care team in [facility] and look for a) attending/consulting [facility] provider listed and b) the [facility] team listed Log into [email or web address] and use [place]'s universal password to access. If you do not have the password, please contact the hospital operator. Locate the [facility] provider you are looking for under [facility] and page to a number that you can be directly reached. If you still have difficulty reaching the provider, please page the [facility] (Director on Call) for the Hospitalists listed on amion for assistance. [email or web address] Password [email or web address] 12/01/2020,  12:44 AM

## 2020-12-01 ENCOUNTER — Encounter: Payer: Self-pay | Admitting: *Deleted

## 2020-12-01 ENCOUNTER — Other Ambulatory Visit: Payer: Self-pay | Admitting: Student

## 2020-12-01 ENCOUNTER — Inpatient Hospital Stay (HOSPITAL_COMMUNITY)
Admit: 2020-12-01 | Discharge: 2020-12-01 | Disposition: A | Discharge status: Home / Self Care | Payer: Medicare Other | Attending: Internal Medicine | Admitting: Internal Medicine

## 2020-12-01 ENCOUNTER — Encounter: Payer: Self-pay | Admitting: Internal Medicine

## 2020-12-01 DIAGNOSIS — M19041 Primary osteoarthritis, right hand: Secondary | ICD-10-CM | POA: Diagnosis not present

## 2020-12-01 DIAGNOSIS — Z993 Dependence on wheelchair: Secondary | ICD-10-CM | POA: Diagnosis not present

## 2020-12-01 DIAGNOSIS — I161 Hypertensive emergency: Secondary | ICD-10-CM

## 2020-12-01 DIAGNOSIS — Z79899 Other long term (current) drug therapy: Secondary | ICD-10-CM | POA: Diagnosis not present

## 2020-12-01 DIAGNOSIS — G459 Transient cerebral ischemic attack, unspecified: Secondary | ICD-10-CM

## 2020-12-01 DIAGNOSIS — Z91041 Radiographic dye allergy status: Secondary | ICD-10-CM | POA: Diagnosis not present

## 2020-12-01 DIAGNOSIS — Z79890 Hormone replacement therapy (postmenopausal): Secondary | ICD-10-CM | POA: Diagnosis not present

## 2020-12-01 DIAGNOSIS — Z85828 Personal history of other malignant neoplasm of skin: Secondary | ICD-10-CM | POA: Diagnosis not present

## 2020-12-01 DIAGNOSIS — G7100 Muscular dystrophy, unspecified: Secondary | ICD-10-CM | POA: Diagnosis not present

## 2020-12-01 DIAGNOSIS — Z20822 Contact with and (suspected) exposure to covid-19: Secondary | ICD-10-CM | POA: Diagnosis not present

## 2020-12-01 DIAGNOSIS — F32A Depression, unspecified: Secondary | ICD-10-CM | POA: Diagnosis not present

## 2020-12-01 DIAGNOSIS — R4701 Aphasia: Secondary | ICD-10-CM | POA: Diagnosis present

## 2020-12-01 DIAGNOSIS — I672 Cerebral atherosclerosis: Secondary | ICD-10-CM | POA: Diagnosis not present

## 2020-12-01 DIAGNOSIS — Z90710 Acquired absence of both cervix and uterus: Secondary | ICD-10-CM | POA: Diagnosis not present

## 2020-12-01 DIAGNOSIS — Z974 Presence of external hearing-aid: Secondary | ICD-10-CM | POA: Diagnosis not present

## 2020-12-01 DIAGNOSIS — M19042 Primary osteoarthritis, left hand: Secondary | ICD-10-CM | POA: Diagnosis not present

## 2020-12-01 DIAGNOSIS — I10 Essential (primary) hypertension: Secondary | ICD-10-CM | POA: Diagnosis not present

## 2020-12-01 DIAGNOSIS — K589 Irritable bowel syndrome without diarrhea: Secondary | ICD-10-CM | POA: Diagnosis not present

## 2020-12-01 DIAGNOSIS — Z7401 Bed confinement status: Secondary | ICD-10-CM | POA: Diagnosis not present

## 2020-12-01 DIAGNOSIS — Z885 Allergy status to narcotic agent status: Secondary | ICD-10-CM | POA: Diagnosis not present

## 2020-12-01 DIAGNOSIS — Z881 Allergy status to other antibiotic agents status: Secondary | ICD-10-CM | POA: Diagnosis not present

## 2020-12-01 DIAGNOSIS — E782 Mixed hyperlipidemia: Secondary | ICD-10-CM | POA: Diagnosis not present

## 2020-12-01 LAB — ECHOCARDIOGRAM COMPLETE
AR max vel: 2.14 cm2
AV Area VTI: 2.6 cm2
AV Area mean vel: 2.4 cm2
AV Mean grad: 4 mmHg
AV Peak grad: 7 mmHg
Ao pk vel: 1.33 m/s
Area-P 1/2: 4.63 cm2
Height: 61 in
S' Lateral: 2.1 cm
Weight: 2719.59 oz

## 2020-12-01 LAB — LIPID PANEL
Cholesterol: 175 mg/dL (ref 0–200)
HDL: 105 mg/dL (ref >40)
LDL Cholesterol: 56 mg/dL (ref 0–99)
Total CHOL/HDL Ratio: 1.7 RATIO
Triglycerides: 68 mg/dL (ref <150)
VLDL: 14 mg/dL (ref 0–40)

## 2020-12-01 LAB — URINALYSIS, COMPLETE (UACMP) WITH MICROSCOPIC
Bilirubin Urine: NEGATIVE
Glucose, UA: NEGATIVE mg/dL
Ketones, ur: NEGATIVE mg/dL
Leukocytes,Ua: NEGATIVE
Nitrite: POSITIVE — AB
Protein, ur: NEGATIVE mg/dL
Specific Gravity, Urine: 1.012 (ref 1.005–1.030)
pH: 6 (ref 5.0–8.0)

## 2020-12-01 MED ORDER — LISINOPRIL 10 MG PO TABS: 20.0000 mg | ORAL_TABLET | Freq: Every day | ORAL | Status: DC

## 2020-12-01 MED ORDER — PAROXETINE HCL 10 MG PO TABS
10.0000 mg | ORAL_TABLET | Freq: Every day | ORAL | Status: DC
  Filled 2020-12-01: qty 1

## 2020-12-01 MED ORDER — ASPIRIN 81 MG PO CHEW: 81.0000 mg | CHEWABLE_TABLET | Freq: Every day | ORAL | Status: AC

## 2020-12-01 MED ORDER — CLOPIDOGREL BISULFATE 75 MG PO TABS
300.0000 mg | ORAL_TABLET | Freq: Once | ORAL | Status: AC
  Administered 2020-12-01: 300 mg via ORAL
  Filled 2020-12-01: qty 4

## 2020-12-01 MED ORDER — HEPARIN SODIUM (PORCINE) 5000 UNIT/ML IJ SOLN
5000.0000 [IU] | Freq: Three times a day (TID) | INTRAMUSCULAR | Status: DC
  Administered 2020-12-01 (×2): 5000 [IU] via SUBCUTANEOUS
  Filled 2020-12-01 (×2): qty 1

## 2020-12-01 MED ORDER — STROKE: EARLY STAGES OF RECOVERY BOOK: Freq: Once | Status: DC

## 2020-12-01 MED ORDER — ACETAMINOPHEN 160 MG/5ML PO SOLN
650.0000 mg | ORAL | Status: DC | PRN
  Filled 2020-12-01: qty 20.3

## 2020-12-01 MED ORDER — ACETAMINOPHEN 325 MG RE SUPP: 650.0000 mg | RECTAL | Status: DC | PRN

## 2020-12-01 MED ORDER — CLOPIDOGREL BISULFATE 75 MG PO TABS: 75.0000 mg | ORAL_TABLET | Freq: Every day | ORAL | Status: DC

## 2020-12-01 MED ORDER — AMITRIPTYLINE HCL 10 MG PO TABS
10.0000 mg | ORAL_TABLET | ORAL | Status: DC | PRN
  Filled 2020-12-01: qty 1

## 2020-12-01 MED ORDER — SODIUM CHLORIDE 0.9 % IV SOLN: INTRAVENOUS | Status: DC

## 2020-12-01 MED ORDER — CLOPIDOGREL BISULFATE 75 MG PO TABS: 75.0000 mg | ORAL_TABLET | Freq: Every day | ORAL | 0 refills | Status: AC

## 2020-12-01 MED ORDER — ACETAMINOPHEN 325 MG PO TABS: 650.0000 mg | ORAL_TABLET | ORAL | Status: DC | PRN

## 2020-12-01 MED ORDER — ASPIRIN 81 MG PO CHEW
81.0000 mg | CHEWABLE_TABLET | Freq: Every day | ORAL | Status: DC
  Administered 2020-12-01: 81 mg via ORAL
  Filled 2020-12-01: qty 1

## 2020-12-01 MED ORDER — LOSARTAN POTASSIUM 50 MG PO TABS
100.0000 mg | ORAL_TABLET | Freq: Every day | ORAL | Status: DC
  Administered 2020-12-01: 100 mg via ORAL
  Filled 2020-12-01: qty 2

## 2020-12-01 NOTE — Progress Notes (Signed)
PT Cancellation Note  Patient Details Name: JEWELIANA DUDGEON MRN: [id number] DOB: 1932/11/19   Cancelled Treatment:    Reason Eval/Treat Not Completed: PT screened, no needs identified, will sign off. Orders received and chart reviewed. Per OT, pt at baseline function relying on caregiver as pt is dependent for ADL's/IADL's due to muscular dystrophy. Pt and caregiver stating to OT that PT does not need to come by as pt and caregiver have all needed equipment for current level of function. PT to sign off.   Salem Caster. Fairly IV, PT, DPT Physical Therapist- [facility]  12/01/2020, 9:31 AM

## 2020-12-01 NOTE — ED Notes (Signed)
Sent msg to providers to clarify whether also giving ASA and heparin prior to discharge.

## 2020-12-01 NOTE — ED Notes (Signed)
Spoke with attending. 300mg  clopidrogel is to be given just prior to discharge and 75mg  to be started by pt tomorrow. Still pending discharge orders.

## 2020-12-01 NOTE — Progress Notes (Signed)
OT Cancellation Note  Patient Details Name: Abigail Cole MRN: [id number] DOB: 1932-07-31   Cancelled Treatment:    Reason Eval/Treat Not Completed: OT screened, no needs identified, will sign off. Upon arrival to room, pt sitting upright in bed awake with caregiver at bedside. Pt and caregiver reporting that pt requires total assistance for ADLs and functional mobility at baseline. Pt and caregiver also reporting that pt has all necessary DME at home (i.e., BSC, mechanical lift) and have no concerns regarding pt's current abilities. No skilled OT needs identified at this time. OT will sign off. Please re-consult if additional needs arise.    Fredirick Maudlin, OTR/L [place]

## 2020-12-01 NOTE — Progress Notes (Signed)
Ordered outpatient 30 day Event Monitor for further evaluation of TIA at the request of Dr. Murray Hodgkins. Patient has never been seen by our office so will also arrange a New Patient Visit with Dr. Harrell Gave (our Doctor of the Day today) in about 6-8 weeks to discuss results.  Darreld Mclean, PA-C 12/01/2020 2:15 PM

## 2020-12-01 NOTE — Assessment & Plan Note (Signed)
--   Not on any apparent medication, LDL 36, further recommendations per neurology

## 2020-12-01 NOTE — ED Notes (Signed)
Hospitalist at bedside 

## 2020-12-01 NOTE — Assessment & Plan Note (Signed)
Continue losartan. 

## 2020-12-01 NOTE — ED Notes (Signed)
Pt placed on bedpan

## 2020-12-01 NOTE — ED Notes (Signed)
SLP was at bedside. Pt swallows pills and food and SLP did not need to perform full swallow study.

## 2020-12-01 NOTE — ED Notes (Signed)
Informed pt and caregiver that per attending doctor pt may discharge after echocardiogram is complete.

## 2020-12-01 NOTE — Progress Notes (Signed)
*  PRELIMINARY RESULTS* Echocardiogram 2D Echocardiogram has been performed.  Abigail Cole 12/01/2020, 10:49 AM

## 2020-12-01 NOTE — Progress Notes (Signed)
Progress Note Abigail Cole   VFI:[id number]  DOB: 1932-09-24  DOA: 11/30/2020     0 Date of Service: 12/01/2020   Clinical Course 87 year old woman PMH muscular dystrophy, bedbound, presented with episode of aphasia or dysarthria and marked hypertension systolic greater than 416. --10/17 code stroke activated and emergency department with brief neurology formal consultation recommending further imaging -- 10/18 reports remains at baseline, no neurologic deficits.  MR imaging unremarkable.  Likely home later today after evaluation by neurology.  Assessment and Plan * TIA (transient ischemic attack) -- History obtained from patient as well as sitter at bedside who was present.  Patient had about a 30-minute episode of expressive aphasia which resolved prior to arrival to the emergency department.  She has muscular dystrophy and very limited use of arms and legs, but no other focal deficits were noted. -- She remains asymptomatic at this point with symptoms being completely resolved.  Favor TIA, MRI and MRA unremarkable CT head was negative.  2D echocardiogram pending. -- Follow-up neurology recommendations, as patient is at baseline, can likely go home later today once echocardiogram done  Hyperlipidemia, mixed -- Not on any apparent medication, LDL 36, further recommendations per neurology  MD (muscular dystrophy) ([place]) -- Stable  Essential hypertension -- Continue losartan  Hypertensive emergency -- Resolved, likely secondary to TIA, blood pressure appears to be at baseline at this point.   Subjective:  Feels back to normal.  Speech is at baseline.  Chronic weakness is at baseline.  Sitter at bedside concurs. She had about a 30-minute episode expressive aphasia yesterday which spontaneously resolved.  No other focal deficits were noted.  Blood pressure tends to run 606-301 systolic at home.  Objective Vitals:   12/01/20 0730 12/01/20 0807 12/01/20 0900 12/01/20 1000  BP: (!)  145/68 (!) 161/60 (!) 160/66 (!) 166/68  Pulse: 80 78 88 90  Resp: 15 (!) 21 (!) 24 18  Temp:      TempSrc:      SpO2: 95% 97% 98% 98%  Weight:      Height:       77.1 kg  Vital signs were reviewed and unremarkable.  Exam Physical Exam Constitutional:      General: She is not in acute distress.    Appearance: She is not ill-appearing or toxic-appearing.  HENT:     Head:     Comments: -- Decreased facial muscle tone Cardiovascular:     Rate and Rhythm: Normal rate and regular rhythm.     Heart sounds: No murmur heard. Pulmonary:     Effort: No respiratory distress.     Breath sounds: No wheezing, rhonchi or rales.  Musculoskeletal:     Right lower leg: No edema.     Left lower leg: No edema.     Comments: -- Very limited movement of arms and legs secondary to muscular dystrophy, chronic.  Neurological:     Mental Status: She is alert.     Comments: No apparent new focal deficit.  Speech is fluent and clear.  Psychiatric:        Mood and Affect: Mood normal.        Behavior: Behavior normal.    Labs / Other Information My review of labs, imaging, notes and other tests is significant for    LDL 56, MRI brain, angio head and neck unremarkable. Echocardiogram pending.  Disposition Plan: Status is: Inpatient  Not inpatient appropriate, will call [facility] team and downgrade to OBS.  Anticipate discharge today.  Full code  Time spent: 35 minutes [facility] 12/01/2020, 11:21 AM

## 2020-12-01 NOTE — Consult Note (Addendum)
Neurology Consultation Reason for Consult: Concern for TIA Requesting Physician: Murray Hodgkins  CC: Difficulty speaking  History is obtained from: Patient and chart review, son at bedside as well as caregiver Nita  HPI: Abigail Cole is a 87 y.o. female with a past medical history of severe muscular dystrophy (wheelchair-bound at baseline), hypertension, hyperlipidemia who had an episode of difficulty speaking for 30 minutes starting at 6:30 PM on 11/30/2020 and resolving before her presentation to the ED, where vital signs were notable for severe hypertension with blood pressures in the 180s/130s.  The case was discussed with Dr. Jacqualine Code on her arrival, and while there is a mention of code stroke activation in other notes, no formal code stroke page was received on the neurology pager. Given that she had returned to her baseline, I did not recommend activating a code stroke and Dr. Jacqualine Code and I discussed that MRI brain should be obtained to determine her blood pressure goal as documented in my plan of care note.   At the time of my evaluation at 1 PM on 1018, the patient and visitors at bedside confirmed that she is back to her normal baseline.  Other than some concern for mild palpitations when she drinks caffeinated beverages her review of systems is pan negative  LKW: 6:30 PM on 10/17 tPA given?: No, symptoms resolved prior to patient's arrival Premorbid modified rankin scale:      5 - Severe disability. Requires constant nursing care and attention, bedridden, incontinent.  ROS: All other review of systems was negative except as noted in the HPI.   Past Medical History:  Diagnosis Date   Arthritis    hands   Cough    when flat on back   Depression    Hypertension    Motion sickness    boats   Muscular dystrophy ([place])    uses wheel chair.  Non-weight bearing   Spastic colon    Squamous cell carcinoma of skin    L hand dorsum - treated years ago, no pathology available   Wears  hearing aid    Current Outpatient Medications  Medication Instructions   amitriptyline (ELAVIL) 10 mg, Oral, As needed   Cholecalciferol (VITAMIN D PO) Oral, Daily   erythromycin ophthalmic ointment 1 application, Both Eyes, Daily at bedtime   estradiol (ESTRACE) 0.5 mg, Oral, Daily   fluticasone (FLONASE) 50 MCG/ACT nasal spray Each Nare, Daily PRN   GuaiFENesin (MUCINEX PO) As needed   lisinopril (ZESTRIL) 20 mg, Daily   losartan (COZAAR) 100 mg, Oral, Daily   PARoxetine (PAXIL) 10 mg, Oral, Daily   Probiotic Product (PROBIOTIC PO) Oral, Daily   silver sulfADIAZINE (SILVADENE) 1 % cream 1 application, Topical, 2 times daily   Family History  Problem Relation Age of Onset   Muscular dystrophy Mother    Muscular dystrophy Father     Social History:  reports that she has never smoked. She has never used smokeless tobacco. She reports that she does not drink alcohol and does not use drugs.  Exam: Current vital signs: BP (!) 166/68   Pulse 90   Temp 98.1 F (36.7 C) (Oral)   Resp 18   Ht 5\' 1"  (1.549 m)   Wt 77.1 kg   SpO2 98%   BMI 32.12 kg/m  Vital signs in last 24 hours: Temp:  [98.1 F (36.7 C)] 98.1 F (36.7 C) (10/17 1944) Pulse Rate:  [78-105] 90 (10/18 1000) Resp:  [13-24] 18 (10/18 1000) BP: (116-188)/(52-134) 166/68 (  10/18 1000) SpO2:  [94 %-100 %] 98 % (10/18 1000) Weight:  [77.1 kg] 77.1 kg (10/17 1948)   Physical Exam  Constitutional: Diffuse muscle atrophy consistent with her muscular dystrophy diagnosis Psych: Affect difficult to interpret given bifacial weakness but calm and cooperative, pleasant voice Eyes: No scleral injection HENT: No oropharyngeal obstruction.  MSK: Chest wall abnormalities consistent with chronic muscular dystrophy Cardiovascular: Normal rate and regular rhythm.  Respiratory: Effort normal, non-labored breathing GI: Soft.  No distension. There is no tenderness.  Skin: Chronic skin changes with multiple lesions that have  healed, followed by dermatology  Neuro: Mental Status: Patient is awake, alert, oriented to person, place, month, year, and situation. Patient is able to give a clear and coherent history. No signs of aphasia or neglect Cranial Nerves: II: Visual Fields are full. Pupils are equal, round, and reactive to light.   III,IV, VI: EOMI without ptosis or diploplia.  V: Facial sensation is symmetric to temperature VII: Facial movement is symmetric.  VIII: hearing is intact to voice X: Uvula elevates symmetrically XI: Shoulder shrug is symmetric. XII: tongue is midline without atrophy or fasciculations.  Motor: Tone is normal. Bulk is notable for diffuse wasting throughout, proximally more than distally Sensory: Sensation is symmetric to light touch and temperature in the arms and legs. Deep Tendon Reflexes: Hypoactive throughout Plantars: Toes are downgoing bilaterally.  Cerebellar: Limited by chronic weakness  NIHSS total 0 for new findings, chronic muscular dystrophy results in baseline score of 16 Score breakdown: 3 for complete bifacial weakness, 3 points for left upper extremity weakness, 3 points for right upper extremity weakness, 3 points for left lower extremity weakness, 3 points for right lower extremity weakness, one-point for mild dysarthria --all chronic   I have reviewed labs in EPIC and the results pertinent to this consultation are:  Lab Results  Component Value Date   CHOL 175 12/01/2020   HDL 105 12/01/2020   LDLCALC 56 12/01/2020   TRIG 68 12/01/2020   CHOLHDL 1.7 12/01/2020   A1c pending  ECHO completed, read pending  I have reviewed the images obtained:  MRI brain without contrast, MRA head without contrast and MRA neck with without contrast personally reviewed and agree with radiology: 1. No acute intracranial process. 2. No large vessel occlusion. The left distal MCA branches are less perfused than the right MCA branches, with possible focal  stenosis in the left insular segment of the MCA. 3. Focal narrowing of the bilateral P2 and P3 segments. [With fetal or near fetal origin of the right PCA]  Echocardiogram obtained, read pending Addendum, read completed:   1. Left ventricular ejection fraction, by estimation, is 60 to 65%. The left ventricle has normal function. Left ventricular endocardial border  not optimally defined to evaluate regional wall motion. Left ventricular diastolic parameters are consistent with Grade I diastolic dysfunction (impaired relaxation). Elevated left atrial pressure.   2. Right ventricular systolic function is normal. The right ventricular size is normal.   3. The mitral valve is grossly normal. Trivial mitral valve regurgitation. No evidence of mitral stenosis.   4. The aortic valve is normal in structure. Aortic valve regurgitation is not visualized. No aortic stenosis is present.   Impression: This is an 87 year old woman with past medical history significant for muscular dystrophy, hypertension, hyperlipidemia presenting with high risk TIA.  Given her muscular dystrophy history as well as intermittent palpitations in the setting of caffeine use, will confirm no significant new cardiac abnormalities prior to discharge,  otherwise we will treat with dual antiplatelet therapy for 21-day course per standard care.  Additionally I do recommend outpatient cardiac event monitoring  Recommendations: # TIA likely secondary to intracranial atherosclerosis - Prophylactic therapy-Antiplatelet med: Aspirin - dose 325mg  PO or 300mg  PR, followed by 81 mg daily - Plavix 300 mg load with 75 mg daily for 21 day - Telemetry monitoring; cardiac event monitor outpatient basis given her muscular dystrophy and review of systems notable for palpitations - Blood pressure goal: gradual normalization as a long-term goal - Neurology will follow up echocardiogram otherwise will be available on an as-needed basis going forward,  please reach out if any questions or concerns arise -- addendum, read as above, no urgent need for change in management based on results. Appropriate for outpatient follow-up including outpatient event monitor  Lesleigh Noe MD-PhD [facility] [phone] [facility] coverage for [facility] is from 8 AM to 4 AM in-house and 4 PM to 8 PM by telephone/video. 8 PM to 8 AM emergent questions or overnight urgent questions should be addressed to Teleneurology On-call or [facility] neurohospitalist; contact information can be found on AMION

## 2020-12-01 NOTE — Progress Notes (Signed)
SLP Cancellation Note  Patient Details Name: CHARLYNE ROBERTSHAW MRN: [id number] DOB: March 08, 1932   Cancelled treatment:       Reason Eval/Treat Not Completed: SLP screened, no needs identified, will sign off (chart reviewed; consulted pt and Family in room). Pt denied any difficulty swallowing and is currently on a regular diet entered by ST/NSG w/ MD notified; pt passed the [place] swallow screen per chart notes. She tolerates swallowing pills w/ water per NSG and Family/pt. Pt conversed in conversation w/out expressive/receptive deficits noted; pt denied any speech-language deficits. Speech clear. Pt does have Baseline Muscular Dystrophy w/ generalized weakness. No further skilled ST services indicated as pt appears at her baseline. Pt/Family agreed. NSG to reconsult if any change in status while admitted. MD updated.       Orinda Kenner, MS, CCC-SLP Speech Language Pathologist [facility] [phone] [facility] 12/01/2020, 2:15 PM

## 2020-12-01 NOTE — Discharge Summary (Signed)
Physician Discharge Summary   Patient name: Abigail Cole  Admit date:     11/30/2020  Discharge date: 12/01/2020  Discharge Physician: Murray Hodgkins   PCP: Rusty Aus, MD   Recommendations at discharge:  Follow-up TIA, pending hemoglobin A1c, consider outpatient referral to neurology Cardiac event monitor plan as an outpatient  Discharge Diagnoses Principal Problem:   TIA (transient ischemic attack) Active Problems:   Essential hypertension   MD (muscular dystrophy) ([place])   Hyperlipidemia, mixed   Hypertensive emergency  Hospital Course   87 year old woman PMH muscular dystrophy, bedbound, presented with episode of aphasia or dysarthria and marked hypertension systolic greater than 956. --10/17 code stroke activated and emergency department with brief neurology formal consultation recommending further imaging -- 10/18 reports remains at baseline, no neurologic deficits.  MR imaging, echocardiogram unremarkable, therapy evaluations unremarkable.  Plan aspirin and Plavix, cardiac event monitor, follow-up as an outpatient.  * TIA (transient ischemic attack) -- History obtained from patient as well as sitter at bedside who was present.  Patient had about a 30-minute episode of expressive aphasia which resolved prior to arrival to the emergency department.  She has muscular dystrophy and very limited use of arms and legs, but no other focal deficits were noted. -- She remains asymptomatic at this point with symptoms being completely resolved.  Favor TIA, MRI and MRA unremarkable CT head was negative.  2D echocardiogram unremarkable. --Stop estradiol, has been on this many years, was started at menopause.  Plavix for 3 weeks, aspirin indefinitely.  No indication for statin based on LDL.  Hyperlipidemia, mixed -- LDL at goal, no indication for statin.  MD (muscular dystrophy) ([place]) -- Stable  Essential hypertension -- Continue losartan  Hypertensive emergency --  Resolved, likely secondary to TIA, blood pressure appears to be at baseline at this point.  Procedures performed: none   Condition at discharge: good  Exam See progress note same day  Disposition: Home  Discharge time: greater than 30 minutes.  Follow-up Information     Buford Dresser, MD Follow up.   Specialty: Cardiology Why: Follow-up with Cardiology scheduled for 01/25/2021 at 3:40pm to review monitor results. If this date/time does not work for you, please call our office to reschedule. Contact information: [place] [address] [phone]         Rusty Aus, MD. Schedule an appointment as soon as possible for a visit in 2 week(s).   Specialty: Internal Medicine Contact information: [address] [place] [address] [phone]                 Allergies as of 12/01/2020       Reactions   Ace Inhibitors    Other reaction(s): Cough Itching    Biaxin [clarithromycin] Nausea And Vomiting   Codeine Nausea And Vomiting   Contrast Media [iodinated Diagnostic Agents] Swelling   Did Ok with pre-medication        Medication List     STOP taking these medications    estradiol 0.5 MG tablet Commonly known as: ESTRACE   lisinopril 20 MG tablet Commonly known as: ZESTRIL   MUCINEX PO       TAKE these medications    amitriptyline 10 MG tablet Commonly known as: ELAVIL Take 10 mg by mouth as needed for sleep.   aspirin 81 MG chewable tablet Chew 1 tablet (81 mg total) by mouth daily.   clopidogrel 75 MG tablet Commonly known as: PLAVIX  Take 1 tablet (75 mg total) by mouth daily. Take for 21 days only Start taking on: December 02, 2020   erythromycin ophthalmic ointment Place 1 application into both eyes at bedtime.   fluticasone 50 MCG/ACT nasal spray Commonly known as: FLONASE Place into both nostrils daily as needed for allergies or rhinitis.   losartan 100  MG tablet Commonly known as: COZAAR Take 100 mg by mouth daily.   PARoxetine 10 MG tablet Commonly known as: PAXIL Take 10 mg by mouth daily.   PROBIOTIC PO Take by mouth daily.   silver sulfADIAZINE 1 % cream Commonly known as: SILVADENE Apply 1 application topically 2 (two) times daily.   VITAMIN D PO Take by mouth daily.        CT HEAD WO CONTRAST (5MM)  Result Date: 11/30/2020 CLINICAL DATA:  Witnessed aphasia, now resolved EXAM: CT HEAD WITHOUT CONTRAST TECHNIQUE: Contiguous axial images were obtained from the base of the skull through the vertex without intravenous contrast. COMPARISON:  None. FINDINGS: Brain: No evidence of acute infarction, hemorrhage, hydrocephalus, extra-axial collection or mass lesion/mass effect. Subcortical white matter and periventricular small vessel ischemic changes. Mild age related cortical atrophy. Vascular: No hyperdense vessel or unexpected calcification. Skull: Normal. Negative for fracture or focal lesion. Sinuses/Orbits: The visualized paranasal sinuses are essentially clear. The mastoid air cells are unopacified. Other: None. IMPRESSION: No evidence of acute intracranial abnormality. Atrophy with small vessel ischemic changes. Electronically Signed   By: Julian Hy M.D.   On: 11/30/2020 20:16   MR ANGIO HEAD WO CONTRAST  Result Date: 11/30/2020 CLINICAL DATA:  Neuro deficit, stroke suspected EXAM: MRI HEAD WITHOUT CONTRAST MRA HEAD WITHOUT CONTRAST MRA NECK WITHOUT AND WITH CONTRAST TECHNIQUE: Multiplanar, multi-echo pulse sequences of the brain and surrounding structures were acquired without intravenous contrast. Angiographic images of the Circle of Willis were acquired using MRA technique without intravenous contrast. Angiographic images of the neck were acquired using MRA technique without and with intravenous contrast. Carotid stenosis measurements (when applicable) are obtained utilizing NASCET criteria, using the distal internal  carotid diameter as the denominator. CONTRAST:  59mL GADAVIST GADOBUTROL 1 MMOL/ML IV SOLN COMPARISON:  No prior MRI, correlation is made with CT head 11/30/2020 FINDINGS: MRI HEAD FINDINGS Brain: No acute infarction, hemorrhage, hydrocephalus, extra-axial collection or mass lesion. No pathologic intracranial enhancement. T2 hyperintense signal in the periventricular white matter, likely the sequela of chronic small vessel ischemic disease. Vascular: See vascular findings below. Skull and upper cervical spine: Normal marrow signal. Sinuses/Orbits: No acute or significant finding. Status post bilateral lens replacements. Other: The mastoids are well aerated. MRA HEAD FINDINGS Anterior circulation: Both internal carotid arteries are patent to the termini, without stenosis or other abnormality. A1 segments patent. Normal anterior communicating artery. Anterior cerebral arteries are patent to their distal aspects. No M1 stenosis or occlusion, although there is mild irregularity. Normal MCA bifurcations. Distal MCA branches perfused, but asymmetric, with decreased opacification of the left MCA branches. Possible focal stenosis in the left insular segment (series 6, image 127). Posterior circulation: Vertebral arteries widely patent to the vertebrobasilar junction without stenosis. Posterior inferior cerebral arteries patent bilaterally. Basilar patent to its distal aspect. Superior cerebral arteries patent bilaterally. Focal narrowing the bilateral P2 and P3 segments (series 6, images 119, 114, and 109). Fetal or near fetal origin of the right PCA. The left posterior communicating artery is not definitively visualized. Anatomic variants: MRA NECK FINDINGS Aortic arch: Standard branching. No significant stenosis or dissection. Right carotid system: No  hemodynamically significant stenosis, dissection, or occlusion. Left carotid system: No hemodynamically significant stenosis, dissection, or occlusion. Vertebral arteries:  Codominant. No significant stenosis or occlusion. Other: None IMPRESSION: 1. No acute intracranial process. 2. No large vessel occlusion. The left distal MCA branches are less perfused than the right MCA branches, with possible focal stenosis in the left insular segment of the MCA. 3. Focal narrowing of the bilateral P2 and P3 segments. Electronically Signed   By: Merilyn Baba M.D.   On: 11/30/2020 22:14   MR Angiogram Neck W or Wo Contrast  Result Date: 11/30/2020 CLINICAL DATA:  Neuro deficit, stroke suspected EXAM: MRI HEAD WITHOUT CONTRAST MRA HEAD WITHOUT CONTRAST MRA NECK WITHOUT AND WITH CONTRAST TECHNIQUE: Multiplanar, multi-echo pulse sequences of the brain and surrounding structures were acquired without intravenous contrast. Angiographic images of the Circle of Willis were acquired using MRA technique without intravenous contrast. Angiographic images of the neck were acquired using MRA technique without and with intravenous contrast. Carotid stenosis measurements (when applicable) are obtained utilizing NASCET criteria, using the distal internal carotid diameter as the denominator. CONTRAST:  53mL GADAVIST GADOBUTROL 1 MMOL/ML IV SOLN COMPARISON:  No prior MRI, correlation is made with CT head 11/30/2020 FINDINGS: MRI HEAD FINDINGS Brain: No acute infarction, hemorrhage, hydrocephalus, extra-axial collection or mass lesion. No pathologic intracranial enhancement. T2 hyperintense signal in the periventricular white matter, likely the sequela of chronic small vessel ischemic disease. Vascular: See vascular findings below. Skull and upper cervical spine: Normal marrow signal. Sinuses/Orbits: No acute or significant finding. Status post bilateral lens replacements. Other: The mastoids are well aerated. MRA HEAD FINDINGS Anterior circulation: Both internal carotid arteries are patent to the termini, without stenosis or other abnormality. A1 segments patent. Normal anterior communicating artery. Anterior  cerebral arteries are patent to their distal aspects. No M1 stenosis or occlusion, although there is mild irregularity. Normal MCA bifurcations. Distal MCA branches perfused, but asymmetric, with decreased opacification of the left MCA branches. Possible focal stenosis in the left insular segment (series 6, image 127). Posterior circulation: Vertebral arteries widely patent to the vertebrobasilar junction without stenosis. Posterior inferior cerebral arteries patent bilaterally. Basilar patent to its distal aspect. Superior cerebral arteries patent bilaterally. Focal narrowing the bilateral P2 and P3 segments (series 6, images 119, 114, and 109). Fetal or near fetal origin of the right PCA. The left posterior communicating artery is not definitively visualized. Anatomic variants: MRA NECK FINDINGS Aortic arch: Standard branching. No significant stenosis or dissection. Right carotid system: No hemodynamically significant stenosis, dissection, or occlusion. Left carotid system: No hemodynamically significant stenosis, dissection, or occlusion. Vertebral arteries: Codominant. No significant stenosis or occlusion. Other: None IMPRESSION: 1. No acute intracranial process. 2. No large vessel occlusion. The left distal MCA branches are less perfused than the right MCA branches, with possible focal stenosis in the left insular segment of the MCA. 3. Focal narrowing of the bilateral P2 and P3 segments. Electronically Signed   By: Merilyn Baba M.D.   On: 11/30/2020 22:14   MR BRAIN WO CONTRAST  Result Date: 11/30/2020 CLINICAL DATA:  Neuro deficit, stroke suspected EXAM: MRI HEAD WITHOUT CONTRAST MRA HEAD WITHOUT CONTRAST MRA NECK WITHOUT AND WITH CONTRAST TECHNIQUE: Multiplanar, multi-echo pulse sequences of the brain and surrounding structures were acquired without intravenous contrast. Angiographic images of the Circle of Willis were acquired using MRA technique without intravenous contrast. Angiographic images of  the neck were acquired using MRA technique without and with intravenous contrast. Carotid stenosis measurements (when applicable) are  obtained utilizing NASCET criteria, using the distal internal carotid diameter as the denominator. CONTRAST:  21mL GADAVIST GADOBUTROL 1 MMOL/ML IV SOLN COMPARISON:  No prior MRI, correlation is made with CT head 11/30/2020 FINDINGS: MRI HEAD FINDINGS Brain: No acute infarction, hemorrhage, hydrocephalus, extra-axial collection or mass lesion. No pathologic intracranial enhancement. T2 hyperintense signal in the periventricular white matter, likely the sequela of chronic small vessel ischemic disease. Vascular: See vascular findings below. Skull and upper cervical spine: Normal marrow signal. Sinuses/Orbits: No acute or significant finding. Status post bilateral lens replacements. Other: The mastoids are well aerated. MRA HEAD FINDINGS Anterior circulation: Both internal carotid arteries are patent to the termini, without stenosis or other abnormality. A1 segments patent. Normal anterior communicating artery. Anterior cerebral arteries are patent to their distal aspects. No M1 stenosis or occlusion, although there is mild irregularity. Normal MCA bifurcations. Distal MCA branches perfused, but asymmetric, with decreased opacification of the left MCA branches. Possible focal stenosis in the left insular segment (series 6, image 127). Posterior circulation: Vertebral arteries widely patent to the vertebrobasilar junction without stenosis. Posterior inferior cerebral arteries patent bilaterally. Basilar patent to its distal aspect. Superior cerebral arteries patent bilaterally. Focal narrowing the bilateral P2 and P3 segments (series 6, images 119, 114, and 109). Fetal or near fetal origin of the right PCA. The left posterior communicating artery is not definitively visualized. Anatomic variants: MRA NECK FINDINGS Aortic arch: Standard branching. No significant stenosis or dissection.  Right carotid system: No hemodynamically significant stenosis, dissection, or occlusion. Left carotid system: No hemodynamically significant stenosis, dissection, or occlusion. Vertebral arteries: Codominant. No significant stenosis or occlusion. Other: None IMPRESSION: 1. No acute intracranial process. 2. No large vessel occlusion. The left distal MCA branches are less perfused than the right MCA branches, with possible focal stenosis in the left insular segment of the MCA. 3. Focal narrowing of the bilateral P2 and P3 segments. Electronically Signed   By: Merilyn Baba M.D.   On: 11/30/2020 22:14   ECHOCARDIOGRAM COMPLETE  Result Date: 12/01/2020    ECHOCARDIOGRAM REPORT   Patient Name:   Abigail Cole Date of Exam: 12/01/2020 Medical Rec #:  [id number]       Height:       61.0 in Accession #:    [id number]      Weight:       170.0 lb Date of Birth:  04-15-1932       BSA:          1.763 m Patient Age:    41 years        BP:           160/66 mmHg Patient Gender: F               HR:           88 bpm. Exam Location:  [facility] Procedure: 2D Echo, Cardiac Doppler and Color Doppler Indications:     Stroke I63.9  History:         Patient has no prior history of Echocardiogram examinations.                  Risk Factors:Hypertension.  Sonographer:     Sherrie Sport Referring Phys:  [id number] Gretta Cool PATEL Diagnosing Phys: Nelva Bush MD  Sonographer Comments: Suboptimal apical window. IMPRESSIONS  1. Left ventricular ejection fraction, by estimation, is 60 to 65%. The left ventricle has normal function. Left ventricular endocardial border not optimally defined to evaluate regional wall motion.  Left ventricular diastolic parameters are consistent with Grade I diastolic dysfunction (impaired relaxation). Elevated left atrial pressure.  2. Right ventricular systolic function is normal. The right ventricular size is normal.  3. The mitral valve is grossly normal. Trivial mitral valve regurgitation. No evidence of mitral  stenosis.  4. The aortic valve is normal in structure. Aortic valve regurgitation is not visualized. No aortic stenosis is present. FINDINGS  Left Ventricle: Left ventricular ejection fraction, by estimation, is 60 to 65%. The left ventricle has normal function. Left ventricular endocardial border not optimally defined to evaluate regional wall motion. The left ventricular internal cavity size was normal in size. There is no left ventricular hypertrophy. Left ventricular diastolic parameters are consistent with Grade I diastolic dysfunction (impaired relaxation). Elevated left atrial pressure. Right Ventricle: The right ventricular size is normal. No increase in right ventricular wall thickness. Right ventricular systolic function is normal. Left Atrium: Left atrial size was normal in size. Right Atrium: Right atrial size was normal in size. Pericardium: The pericardium was not well visualized. Mitral Valve: The mitral valve is grossly normal. Trivial mitral valve regurgitation. No evidence of mitral valve stenosis. Tricuspid Valve: The tricuspid valve is not well visualized. Tricuspid valve regurgitation is not demonstrated. Aortic Valve: The aortic valve is normal in structure. Aortic valve regurgitation is not visualized. No aortic stenosis is present. Aortic valve mean gradient measures 4.0 mmHg. Aortic valve peak gradient measures 7.0 mmHg. Aortic valve area, by VTI measures 2.60 cm. Pulmonic Valve: The pulmonic valve was not well visualized. Pulmonic valve regurgitation is not visualized. No evidence of pulmonic stenosis. Aorta: The aortic root is normal in size and structure. Pulmonary Artery: The pulmonary artery is of normal size. Venous: The inferior vena cava was not well visualized. IAS/Shunts: The interatrial septum was not well visualized.  LEFT VENTRICLE PLAX 2D LVIDd:         2.90 cm   Diastology LVIDs:         2.10 cm   LV e' medial:    4.68 cm/s LV PW:         1.00 cm   LV E/e' medial:  17.5 LV  IVS:        0.65 cm   LV e' lateral:   5.00 cm/s LVOT diam:     2.00 cm   LV E/e' lateral: 16.4 LV SV:         60 LV SV Index:   34 LVOT Area:     3.14 cm  RIGHT VENTRICLE RV Basal diam:  2.30 cm RV S prime:     12.00 cm/s TAPSE (M-mode): 2.4 cm LEFT ATRIUM           Index       RIGHT ATRIUM           Index LA diam:      1.90 cm 1.08 cm/m  RA Area:     10.40 cm LA Vol (A2C): 2.8 ml  1.60 ml/m  RA Volume:   20.20 ml  11.46 ml/m LA Vol (A4C): 6.9 ml  3.93 ml/m  AORTIC VALVE                    PULMONIC VALVE AV Area (Vmax):    2.14 cm     PV Vmax:        1.18 m/s AV Area (Vmean):   2.40 cm     PV Peak grad:   5.6 mmHg AV Area (VTI):  2.60 cm     RVOT Peak grad: 5 mmHg AV Vmax:           132.50 cm/s AV Vmean:          94.800 cm/s AV VTI:            0.232 m AV Peak Grad:      7.0 mmHg AV Mean Grad:      4.0 mmHg LVOT Vmax:         90.10 cm/s LVOT Vmean:        72.300 cm/s LVOT VTI:          0.192 m LVOT/AV VTI ratio: 0.83  AORTA Ao Root diam: 2.00 cm MITRAL VALVE                TRICUSPID VALVE MV Area (PHT): 4.63 cm     TR Peak grad:   19.5 mmHg MV Decel Time: 164 msec     TR Vmax:        221.00 cm/s MV E velocity: 81.80 cm/s MV A velocity: 114.00 cm/s  SHUNTS MV E/A ratio:  0.72         Systemic VTI:  0.19 m                             Systemic Diam: 2.00 cm Nelva Bush MD Electronically signed by Nelva Bush MD Signature Date/Time: 12/01/2020/1:52:59 PM    Final    Results for orders placed or performed during the hospital encounter of 11/30/20  Resp Panel by RT-PCR (Flu A&B, Covid) Nasopharyngeal Swab     Status: None   Collection Time: 11/30/20  9:41 PM   Specimen: Nasopharyngeal Swab; Nasopharyngeal(NP) swabs in vial transport medium  Result Value Ref Range Status   SARS Coronavirus 2 by RT PCR NEGATIVE NEGATIVE Final    Comment: (NOTE) SARS-CoV-2 target nucleic acids are NOT DETECTED.  The SARS-CoV-2 RNA is generally detectable in upper respiratory specimens during the acute phase of  infection. The lowest concentration of SARS-CoV-2 viral copies this assay can detect is 138 copies/mL. A negative result does not preclude SARS-Cov-2 infection and should not be used as the sole basis for treatment or other patient management decisions. A negative result may occur with  improper specimen collection/handling, submission of specimen other than nasopharyngeal swab, presence of viral mutation(s) within the areas targeted by this assay, and inadequate number of viral copies(<138 copies/mL). A negative result must be combined with clinical observations, patient history, and epidemiological information. The expected result is Negative.  Fact Sheet for Patients:  EntrepreneurPulse.com.au  Fact Sheet for Healthcare Providers:  IncredibleEmployment.be  This test is no t yet approved or cleared by the [place] FDA and  has been authorized for detection and/or diagnosis of SARS-CoV-2 by FDA under an Emergency Use Authorization (EUA). This EUA will remain  in effect (meaning this test can be used) for the duration of the COVID-19 declaration under Section 564(b)(1) of the Act, 21 U.S.C.section 360bbb-3(b)(1), unless the authorization is terminated  or revoked sooner.       Influenza A by PCR NEGATIVE NEGATIVE Final   Influenza B by PCR NEGATIVE NEGATIVE Final    Comment: (NOTE) The Xpert Xpress SARS-CoV-2/FLU/RSV plus assay is intended as an aid in the diagnosis of influenza from Nasopharyngeal swab specimens and should not be used as a sole basis for treatment. Nasal washings and aspirates are unacceptable for Xpert Xpress SARS-CoV-2/FLU/RSV testing.  Fact Sheet for Patients: EntrepreneurPulse.com.au  Fact Sheet for Healthcare Providers: IncredibleEmployment.be  This test is not yet approved or cleared by the [place] FDA and has been authorized for detection and/or diagnosis of SARS-CoV-2  by FDA under an Emergency Use Authorization (EUA). This EUA will remain in effect (meaning this test can be used) for the duration of the COVID-19 declaration under Section 564(b)(1) of the Act, 21 U.S.C. section 360bbb-3(b)(1), unless the authorization is terminated or revoked.  Performed at [facility], [address] [place], [place] [address]     Signed:  Murray Hodgkins MD.  [facility] 12/01/2020, 3:16 PM

## 2020-12-01 NOTE — Hospital Course (Addendum)
87 year old woman PMH muscular dystrophy, bedbound, presented with episode of aphasia or dysarthria and marked hypertension systolic greater than 128. --10/17 code stroke activated and emergency department with brief neurology formal consultation recommending further imaging -- 10/18 reports remains at baseline, no neurologic deficits.  MR imaging unremarkable.  Likely home later today after evaluation by neurology.

## 2020-12-01 NOTE — Assessment & Plan Note (Signed)
Stable

## 2020-12-01 NOTE — Assessment & Plan Note (Signed)
--   History obtained from patient as well as sitter at bedside who was present.  Patient had about a 30-minute episode of expressive aphasia which resolved prior to arrival to the emergency department.  She has muscular dystrophy and very limited use of arms and legs, but no other focal deficits were noted. -- She remains asymptomatic at this point with symptoms being completely resolved.  Favor TIA, MRI and MRA unremarkable CT head was negative.  2D echocardiogram pending. -- Follow-up neurology recommendations, as patient is at baseline, can likely go home later today once echocardiogram done

## 2020-12-01 NOTE — Progress Notes (Signed)
Patient ID: STORY CONTI, female   DOB: 1932-11-28, 87 y.o.   MRN: [id number] Patient enrolled for [employer] to ship a 30 day cardiac event monitor to her address on file. Letter with instructions mailed to patient.

## 2020-12-01 NOTE — Assessment & Plan Note (Signed)
--   Resolved, likely secondary to TIA, blood pressure appears to be at baseline at this point.

## 2020-12-02 LAB — HEMOGLOBIN A1C
Hgb A1c MFr Bld: 5.3 % (ref 4.8–5.6)
Mean Plasma Glucose: 105 mg/dL

## 2020-12-17 ENCOUNTER — Telehealth: Payer: Self-pay | Admitting: *Deleted

## 2020-12-17 NOTE — Telephone Encounter (Signed)
Patient called in stating PCP does not feel it necessary to follow up with cardiology/ event monitor at this time.  Patient requested information on how to ship back [employer] event monitor.  Instructed there is a prepaid [employer] shipping label on the blue [employer] monitor box.  Patient just needs to pull adhesive strip to seal and drop off at [employer] facility or drop location.

## 2021-01-25 ENCOUNTER — Ambulatory Visit (HOSPITAL_BASED_OUTPATIENT_CLINIC_OR_DEPARTMENT_OTHER): Payer: Medicare Other | Admitting: Cardiology

## 2021-06-02 ENCOUNTER — Ambulatory Visit (INDEPENDENT_AMBULATORY_CARE_PROVIDER_SITE_OTHER): Payer: Medicare Other | Admitting: Dermatology

## 2021-06-02 DIAGNOSIS — D485 Neoplasm of uncertain behavior of skin: Secondary | ICD-10-CM | POA: Diagnosis not present

## 2021-06-02 DIAGNOSIS — L820 Inflamed seborrheic keratosis: Secondary | ICD-10-CM

## 2021-06-02 DIAGNOSIS — L578 Other skin changes due to chronic exposure to nonionizing radiation: Secondary | ICD-10-CM

## 2021-06-02 DIAGNOSIS — D492 Neoplasm of unspecified behavior of bone, soft tissue, and skin: Secondary | ICD-10-CM

## 2021-06-02 MED ORDER — MOMETASONE FUROATE 0.1 % EX CREA
1.0000 | TOPICAL_CREAM | CUTANEOUS | 0 refills | Status: AC
Start: 2021-06-02 — End: ?

## 2021-06-02 NOTE — Patient Instructions (Addendum)

## 2021-06-02 NOTE — Progress Notes (Signed)
? ?Follow-Up Visit ?  ?Subjective  ?Abigail Cole is a 88 y.o. female who presents for the following: Actinic Keratosis (Hands, 34mf/u) and check spots (Scalp, itchy, hx of bleeding/Arms, irritating, the one on L arm hx of bleeding after hitting/Chest, few weeks, spreading). ?The patient has spots, moles and lesions to be evaluated, some may be new or changing and the patient has concerns that these could be cancer. ? ?Patient accompanied by caregiver who contributes to history. ? ?The following portions of the chart were reviewed this encounter and updated as appropriate:  ? Tobacco  Allergies  Meds  Problems  Med Hx  Surg Hx  Fam Hx   ?  ?Review of Systems:  No other skin or systemic complaints except as noted in HPI or Assessment and Plan. ? ?Objective  ?Well appearing patient in no apparent distress; mood and affect are within normal limits. ? ?A focused examination was performed including scalp, arms, hands, chest. Relevant physical exam findings are noted in the Assessment and Plan. ? ?R medial tricep ?0.8cm hyperkeratotic pap ? ? ? ? ?Left Forearm ?1.1cm hyperkeratotic pap ? ? ? ? ?L chest x 1, scalp x 2 (3) ?Stuck on waxy paps with erythema ? ? ?Assessment & Plan  ?Neoplasm of skin (2) ?R medial tricep ? ?Epidermal / dermal shaving ? ?Lesion diameter (cm):  0.8 ?Informed consent: discussed and consent obtained   ?Timeout: patient name, date of birth, surgical site, and procedure verified   ?Procedure prep:  Patient was prepped and draped in usual sterile fashion ?Prep type:  Isopropyl alcohol ?Anesthesia: the lesion was anesthetized in a standard fashion   ?Anesthetic:  1% lidocaine w/ epinephrine 1-100,000 buffered w/ 8.4% NaHCO3 ?Instrument used: flexible razor blade   ?Hemostasis achieved with: pressure, aluminum chloride and electrodesiccation   ?Outcome: patient tolerated procedure well   ?Post-procedure details: sterile dressing applied and wound care instructions given   ?Dressing type:  bandage and bacitracin   ? ?Destruction of lesion ?Complexity: extensive   ?Destruction method: electrodesiccation and curettage   ?Informed consent: discussed and consent obtained   ?Timeout:  patient name, date of birth, surgical site, and procedure verified ?Procedure prep:  Patient was prepped and draped in usual sterile fashion ?Prep type:  Isopropyl alcohol ?Anesthesia: the lesion was anesthetized in a standard fashion   ?Anesthetic:  1% lidocaine w/ epinephrine 1-100,000 buffered w/ 8.4% NaHCO3 ?Curettage performed in three different directions: Yes   ?Electrodesiccation performed over the curetted area: Yes   ?Lesion length (cm):  0.8 ?Lesion width (cm):  0.8 ?Margin per side (cm):  0.2 ?Final wound size (cm):  1.2 ?Hemostasis achieved with:  pressure, aluminum chloride and electrodesiccation ?Outcome: patient tolerated procedure well with no complications   ?Post-procedure details: sterile dressing applied and wound care instructions given   ?Dressing type: bandage and bacitracin   ? ?Specimen 2 - Surgical pathology ?Differential Diagnosis: D48.5 ISK r/o SCC ? ?Check Margins: No ?0.8cm hyperkeratotic pap ?EDC ? ?Left Forearm ? ?Epidermal / dermal shaving ? ?Lesion diameter (cm):  1.1 ?Informed consent: discussed and consent obtained   ?Timeout: patient name, date of birth, surgical site, and procedure verified   ?Procedure prep:  Patient was prepped and draped in usual sterile fashion ?Prep type:  Isopropyl alcohol ?Anesthesia: the lesion was anesthetized in a standard fashion   ?Anesthetic:  1% lidocaine w/ epinephrine 1-100,000 buffered w/ 8.4% NaHCO3 ?Instrument used: flexible razor blade   ?Hemostasis achieved with: pressure, aluminum chloride and electrodesiccation   ?  Outcome: patient tolerated procedure well   ?Post-procedure details: sterile dressing applied and wound care instructions given   ?Dressing type: bandage and petrolatum   ? ?Destruction of lesion ?Complexity: extensive   ?Destruction  method: electrodesiccation and curettage   ?Informed consent: discussed and consent obtained   ?Timeout:  patient name, date of birth, surgical site, and procedure verified ?Procedure prep:  Patient was prepped and draped in usual sterile fashion ?Prep type:  Isopropyl alcohol ?Anesthesia: the lesion was anesthetized in a standard fashion   ?Anesthetic:  1% lidocaine w/ epinephrine 1-100,000 buffered w/ 8.4% NaHCO3 ?Curettage performed in three different directions: Yes   ?Electrodesiccation performed over the curetted area: Yes   ?Lesion length (cm):  1.1 ?Lesion width (cm):  1.1 ?Margin per side (cm):  0.2 ?Final wound size (cm):  1.5 ?Hemostasis achieved with:  pressure, aluminum chloride and electrodesiccation ?Outcome: patient tolerated procedure well with no complications   ?Post-procedure details: sterile dressing applied and wound care instructions given   ?Dressing type: bandage and bacitracin   ? ?Specimen 1 - Surgical pathology ?Differential Diagnosis: D48.5 Isk r/o SCC ? ?Check Margins: No ?1.1cm hyperkeratotic pap ?EDC ? ?Inflamed seborrheic keratosis (3) ?L chest x 1, scalp x 2 ? ?R chest, Start Mometasone cream qd up to 5d/wk until clear or non inflamed ? ?Topical steroids (such as triamcinolone, fluocinolone, fluocinonide, mometasone, clobetasol, halobetasol, betamethasone, hydrocortisone) can cause thinning and lightening of the skin if they are used for too long in the same area. Your physician has selected the right strength medicine for your problem and area affected on the body. Please use your medication only as directed by your physician to prevent side effects.   ? ?Destruction of lesion - L chest x 1, scalp x 2 ?Complexity: simple   ?Destruction method: cryotherapy   ?Informed consent: discussed and consent obtained   ?Timeout:  patient name, date of birth, surgical site, and procedure verified ?Lesion destroyed using liquid nitrogen: Yes   ?Region frozen until ice ball extended beyond  lesion: Yes   ?Outcome: patient tolerated procedure well with no complications   ?Post-procedure details: wound care instructions given   ? ?mometasone (ELOCON) 0.1 % cream - L chest x 1, scalp x 2 ?Apply 1 application. topically as directed. Qd up to 5d/wk aa right chest until clear ? ?Actinic Damage ?- chronic, secondary to cumulative UV radiation exposure/sun exposure over time ?- diffuse scaly erythematous macules with underlying dyspigmentation ?- Recommend daily broad spectrum sunscreen SPF 30+ to sun-exposed areas, reapply every 2 hours as needed.  ?- Recommend staying in the shade or wearing long sleeves, sun glasses (UVA+UVB protection) and wide brim hats (4-inch brim around the entire circumference of the hat). ?- Call for new or changing lesions. ? ?Return in about 3 months (around 09/01/2021) for f/u ISK scalp. ? ?I, Othelia Pulling, RMA, am acting as scribe for Sarina Ser, MD .  ?Documentation: I have reviewed the above documentation for accuracy and completeness, and I agree with the above. ? ?Sarina Ser, MD ? ?

## 2021-06-07 ENCOUNTER — Telehealth: Payer: Self-pay

## 2021-06-07 NOTE — Telephone Encounter (Signed)
-----   Message from Ralene Bathe, MD sent at 06/03/2021  6:14 PM EDT ----- ?Diagnosis ?1. Skin , left forearm ?SEBORRHEIC KERATOSIS, INFLAMED ?2. Skin , right medial tricep ?VERRUCA VULGARIS, IRRITATED ? ?1-benign inflamed keratosis ?No further treatment needed at this time. ?2-benign irritated viral wart ?May recur ?No further treatment needed at this time. ?

## 2021-06-07 NOTE — Telephone Encounter (Signed)
Left pt msg to call for bx results/sh 

## 2021-06-08 ENCOUNTER — Telehealth: Payer: Self-pay

## 2021-06-08 NOTE — Telephone Encounter (Signed)
Patient advised of BX results .aw 

## 2021-06-08 NOTE — Telephone Encounter (Signed)
-----   Message from Ralene Bathe, MD sent at 06/03/2021  6:14 PM EDT ----- ?Diagnosis ?1. Skin , left forearm ?SEBORRHEIC KERATOSIS, INFLAMED ?2. Skin , right medial tricep ?VERRUCA VULGARIS, IRRITATED ? ?1-benign inflamed keratosis ?No further treatment needed at this time. ?2-benign irritated viral wart ?May recur ?No further treatment needed at this time. ?

## 2021-06-11 ENCOUNTER — Encounter: Payer: Self-pay | Admitting: Dermatology

## 2021-09-01 ENCOUNTER — Ambulatory Visit (INDEPENDENT_AMBULATORY_CARE_PROVIDER_SITE_OTHER): Payer: Medicare Other | Admitting: Dermatology

## 2021-09-01 DIAGNOSIS — L820 Inflamed seborrheic keratosis: Secondary | ICD-10-CM

## 2021-09-01 DIAGNOSIS — L578 Other skin changes due to chronic exposure to nonionizing radiation: Secondary | ICD-10-CM | POA: Diagnosis not present

## 2021-09-01 DIAGNOSIS — L821 Other seborrheic keratosis: Secondary | ICD-10-CM

## 2021-09-01 DIAGNOSIS — T148XXA Other injury of unspecified body region, initial encounter: Secondary | ICD-10-CM

## 2021-09-01 DIAGNOSIS — S8012XA Contusion of left lower leg, initial encounter: Secondary | ICD-10-CM

## 2021-09-01 NOTE — Patient Instructions (Addendum)
Cryotherapy Aftercare  Wash gently with soap and water everyday.   Apply Vaseline and Band-Aid daily until healed.     Due to recent changes in healthcare laws, you may see results of your pathology and/or laboratory studies on MyChart before the doctors have had a chance to review them. We understand that in some cases there may be results that are confusing or concerning to you. Please understand that not all results are received at the same time and often the doctors may need to interpret multiple results in order to provide you with the best plan of care or course of treatment. Therefore, we ask that you please give us 2 business days to thoroughly review all your results before contacting the office for clarification. Should we see a critical lab result, you will be contacted sooner.   If You Need Anything After Your Visit  If you have any questions or concerns for your doctor, please call our main line at [phone] and press option 4 to reach your doctor's medical assistant. If no one answers, please leave a voicemail as directed and we will return your call as soon as possible. Messages left after 4 pm will be answered the following business day.   You may also send us a message via MyChart. We typically respond to MyChart messages within 1-2 business days.  For prescription refills, please ask your pharmacy to contact our office. Our fax number is [phone].  If you have an urgent issue when the clinic is closed that cannot wait until the next business day, you can page your doctor at the number below.    Please note that while we do our best to be available for urgent issues outside of office hours, we are not available 24/7.   If you have an urgent issue and are unable to reach us, you may choose to seek medical care at your doctor's office, retail clinic, urgent care center, or emergency room.  If you have a medical emergency, please immediately call 911 or go to the  emergency department.  Pager Numbers  - Dr. Kowalski: [phone]  - Dr. Moye: [phone]  - Dr. Stewart: [phone]  In the event of inclement weather, please call our main line at [phone] for an update on the status of any delays or closures.  Dermatology Medication Tips: Please keep the boxes that topical medications come in in order to help keep track of the instructions about where and how to use these. Pharmacies typically print the medication instructions only on the boxes and not directly on the medication tubes.   If your medication is too expensive, please contact our office at [phone] option 4 or send us a message through MyChart.   We are unable to tell what your co-pay for medications will be in advance as this is different depending on your insurance coverage. However, we may be able to find a substitute medication at lower cost or fill out paperwork to get insurance to cover a needed medication.   If a prior authorization is required to get your medication covered by your insurance company, please allow us 1-2 business days to complete this process.  Drug prices often vary depending on where the prescription is filled and some pharmacies may offer cheaper prices.  The website www.goodrx.com contains coupons for medications through different pharmacies. The prices here do not account for what the cost may be with help from insurance (it may be cheaper with your insurance), but the website can   give you the price if you did not use any insurance.  - You can print the associated coupon and take it with your prescription to the pharmacy.  - You may also stop by our office during regular business hours and pick up a GoodRx coupon card.  - If you need your prescription sent electronically to a different pharmacy, notify our office through [facility] MyChart or by phone at [phone] option 4.     Si Usted Necesita Algo Despus de Su Visita  Tambin puede  enviarnos un mensaje a travs de MyChart. Por lo general respondemos a los mensajes de MyChart en el transcurso de 1 a 2 das hbiles.  Para renovar recetas, por favor pida a su farmacia que se ponga en contacto con nuestra oficina. Nuestro nmero de fax es el [phone].  Si tiene un asunto urgente cuando la clnica est cerrada y que no puede esperar hasta el siguiente da hbil, puede llamar/localizar a su doctor(a) al nmero que aparece a continuacin.   Por favor, tenga en cuenta que aunque hacemos todo lo posible para estar disponibles para asuntos urgentes fuera del horario de oficina, no estamos disponibles las 24 horas del da, los 7 das de la semana.   Si tiene un problema urgente y no puede comunicarse con nosotros, puede optar por buscar atencin mdica  en el consultorio de su doctor(a), en una clnica privada, en un centro de atencin urgente o en una sala de emergencias.  Si tiene una emergencia mdica, por favor llame inmediatamente al 911 o vaya a la sala de emergencias.  Nmeros de bper  - Dr. Kowalski: [phone]  - Dra. Moye: [phone]  - Dra. Stewart: [phone]  En caso de inclemencias del tiempo, por favor llame a nuestra lnea principal al [phone] para una actualizacin sobre el estado de cualquier retraso o cierre.  Consejos para la medicacin en dermatologa: Por favor, guarde las cajas en las que vienen los medicamentos de uso tpico para ayudarle a seguir las instrucciones sobre dnde y cmo usarlos. Las farmacias generalmente imprimen las instrucciones del medicamento slo en las cajas y no directamente en los tubos del medicamento.   Si su medicamento es muy caro, por favor, pngase en contacto con nuestra oficina llamando al [phone] y presione la opcin 4 o envenos un mensaje a travs de MyChart.   No podemos decirle cul ser su copago por los medicamentos por adelantado ya que esto es diferente dependiendo de la cobertura de su seguro.  Sin embargo, es posible que podamos encontrar un medicamento sustituto a menor costo o llenar un formulario para que el seguro cubra el medicamento que se considera necesario.   Si se requiere una autorizacin previa para que su compaa de seguros cubra su medicamento, por favor permtanos de 1 a 2 das hbiles para completar este proceso.  Los precios de los medicamentos varan con frecuencia dependiendo del lugar de dnde se surte la receta y alguna farmacias pueden ofrecer precios ms baratos.  El sitio web www.goodrx.com tiene cupones para medicamentos de diferentes farmacias. Los precios aqu no tienen en cuenta lo que podra costar con la ayuda del seguro (puede ser ms barato con su seguro), pero el sitio web puede darle el precio si no utiliz ningn seguro.  - Puede imprimir el cupn correspondiente y llevarlo con su receta a la farmacia.  - Tambin puede pasar por nuestra oficina durante el horario de atencin regular y recoger una tarjeta de cupones de GoodRx.  -   Si necesita que su receta se enve electrnicamente a una farmacia diferente, informe a nuestra oficina a travs de MyChart de [place] o por telfono llamando al [phone] y presione la opcin 4.  

## 2021-09-01 NOTE — Progress Notes (Signed)
   Follow-Up Visit   Subjective  Abigail Cole is a 88 y.o. female who presents for the following: ISK (Scalp, 11mf/u) and check injury (L lower leg, 1 day, painful). The patient has spots, moles and lesions to be evaluated, some may be new or changing and the patient has concerns that these could be cancer.  Patient accompanied by caregiver who contributes to history.  The following portions of the chart were reviewed this encounter and updated as appropriate:   Tobacco  Allergies  Meds  Problems  Med Hx  Surg Hx  Fam Hx     Review of Systems:  No other skin or systemic complaints except as noted in HPI or Assessment and Plan.  Objective  Well appearing patient in no apparent distress; mood and affect are within normal limits.  A focused examination was performed including scalp, left lower leg. Relevant physical exam findings are noted in the Assessment and Plan.  top of scalp x 4, R sideburn x 1, L temple/sideburn x 7 (12) Stuck on waxy paps with erythema  L pretibia Hematomas L pretibia   Assessment & Plan  Inflamed seborrheic keratosis (12) top of scalp x 4, R sideburn x 1, L temple/sideburn x 7 Symptomatic, irritating, patient would like treated. Destruction of lesion - top of scalp x 4, R sideburn x 1, L temple/sideburn x 7 Complexity: simple   Destruction method: cryotherapy   Informed consent: discussed and consent obtained   Timeout:  patient name, date of birth, surgical site, and procedure verified Lesion destroyed using liquid nitrogen: Yes   Region frozen until ice ball extended beyond lesion: Yes   Outcome: patient tolerated procedure well with no complications   Post-procedure details: wound care instructions given    Related Medications mometasone (ELOCON) 0.1 % cream Apply 1 application. topically as directed. Qd up to 5d/wk aa right chest until clear  Hematoma L pretibia 2ndary to traumatic injury Recommend starting Voltaren gel for pain as  needed Can cont arnica cream Follow-up with PCP  Seborrheic Keratoses - Stuck-on, waxy, tan-brown papules and/or plaques  - Benign-appearing - Discussed benign etiology and prognosis. - Observe - Call for any changes  Actinic Damage - chronic, secondary to cumulative UV radiation exposure/sun exposure over time - diffuse scaly erythematous macules with underlying dyspigmentation - Recommend daily broad spectrum sunscreen SPF 30+ to sun-exposed areas, reapply every 2 hours as needed.  - Recommend staying in the shade or wearing long sleeves, sun glasses (UVA+UVB protection) and wide brim hats (4-inch brim around the entire circumference of the hat). - Call for new or changing lesions.  Return in about 4 months (around 01/02/2022) for ISK f/u.  I, SOthelia Pulling RMA, am acting as scribe for DSarina Ser MD . Documentation: I have reviewed the above documentation for accuracy and completeness, and I agree with the above.  DSarina Ser MD

## 2021-09-11 ENCOUNTER — Encounter: Payer: Self-pay | Admitting: Dermatology

## 2021-12-22 ENCOUNTER — Ambulatory Visit: Payer: Medicare Other | Admitting: Dermatology

## 2021-12-29 ENCOUNTER — Ambulatory Visit: Payer: Medicare Other | Admitting: Dermatology

## 2022-03-09 ENCOUNTER — Ambulatory Visit (INDEPENDENT_AMBULATORY_CARE_PROVIDER_SITE_OTHER): Payer: Medicare Other | Admitting: Dermatology

## 2022-03-09 DIAGNOSIS — L821 Other seborrheic keratosis: Secondary | ICD-10-CM | POA: Diagnosis not present

## 2022-03-09 DIAGNOSIS — D692 Other nonthrombocytopenic purpura: Secondary | ICD-10-CM | POA: Diagnosis not present

## 2022-03-09 DIAGNOSIS — L820 Inflamed seborrheic keratosis: Secondary | ICD-10-CM

## 2022-03-09 DIAGNOSIS — L578 Other skin changes due to chronic exposure to nonionizing radiation: Secondary | ICD-10-CM

## 2022-03-09 NOTE — Patient Instructions (Signed)
Cryotherapy Aftercare  Wash gently with soap and water everyday.   Apply Vaseline and Band-Aid daily until healed.     Due to recent changes in healthcare laws, you may see results of your pathology and/or laboratory studies on MyChart before the doctors have had a chance to review them. We understand that in some cases there may be results that are confusing or concerning to you. Please understand that not all results are received at the same time and often the doctors may need to interpret multiple results in order to provide you with the best plan of care or course of treatment. Therefore, we ask that you please give us 2 business days to thoroughly review all your results before contacting the office for clarification. Should we see a critical lab result, you will be contacted sooner.   If You Need Anything After Your Visit  If you have any questions or concerns for your doctor, please call our main line at [phone] and press option 4 to reach your doctor's medical assistant. If no one answers, please leave a voicemail as directed and we will return your call as soon as possible. Messages left after 4 pm will be answered the following business day.   You may also send us a message via MyChart. We typically respond to MyChart messages within 1-2 business days.  For prescription refills, please ask your pharmacy to contact our office. Our fax number is [phone].  If you have an urgent issue when the clinic is closed that cannot wait until the next business day, you can page your doctor at the number below.    Please note that while we do our best to be available for urgent issues outside of office hours, we are not available 24/7.   If you have an urgent issue and are unable to reach us, you may choose to seek medical care at your doctor's office, retail clinic, urgent care center, or emergency room.  If you have a medical emergency, please immediately call 911 or go to the  emergency department.  Pager Numbers  - Dr. Kowalski: [phone]  - Dr. Moye: [phone]  - Dr. Stewart: [phone]  In the event of inclement weather, please call our main line at [phone] for an update on the status of any delays or closures.  Dermatology Medication Tips: Please keep the boxes that topical medications come in in order to help keep track of the instructions about where and how to use these. Pharmacies typically print the medication instructions only on the boxes and not directly on the medication tubes.   If your medication is too expensive, please contact our office at [phone] option 4 or send us a message through MyChart.   We are unable to tell what your co-pay for medications will be in advance as this is different depending on your insurance coverage. However, we may be able to find a substitute medication at lower cost or fill out paperwork to get insurance to cover a needed medication.   If a prior authorization is required to get your medication covered by your insurance company, please allow us 1-2 business days to complete this process.  Drug prices often vary depending on where the prescription is filled and some pharmacies may offer cheaper prices.  The website www.goodrx.com contains coupons for medications through different pharmacies. The prices here do not account for what the cost may be with help from insurance (it may be cheaper with your insurance), but the website can   give you the price if you did not use any insurance.  - You can print the associated coupon and take it with your prescription to the pharmacy.  - You may also stop by our office during regular business hours and pick up a GoodRx coupon card.  - If you need your prescription sent electronically to a different pharmacy, notify our office through [facility] MyChart or by phone at [phone] option 4.     Si Usted Necesita Algo Despus de Su Visita  Tambin puede  enviarnos un mensaje a travs de MyChart. Por lo general respondemos a los mensajes de MyChart en el transcurso de 1 a 2 das hbiles.  Para renovar recetas, por favor pida a su farmacia que se ponga en contacto con nuestra oficina. Nuestro nmero de fax es el [phone].  Si tiene un asunto urgente cuando la clnica est cerrada y que no puede esperar hasta el siguiente da hbil, puede llamar/localizar a su doctor(a) al nmero que aparece a continuacin.   Por favor, tenga en cuenta que aunque hacemos todo lo posible para estar disponibles para asuntos urgentes fuera del horario de oficina, no estamos disponibles las 24 horas del da, los 7 das de la semana.   Si tiene un problema urgente y no puede comunicarse con nosotros, puede optar por buscar atencin mdica  en el consultorio de su doctor(a), en una clnica privada, en un centro de atencin urgente o en una sala de emergencias.  Si tiene una emergencia mdica, por favor llame inmediatamente al 911 o vaya a la sala de emergencias.  Nmeros de bper  - Dr. Kowalski: [phone]  - Dra. Moye: [phone]  - Dra. Stewart: [phone]  En caso de inclemencias del tiempo, por favor llame a nuestra lnea principal al [phone] para una actualizacin sobre el estado de cualquier retraso o cierre.  Consejos para la medicacin en dermatologa: Por favor, guarde las cajas en las que vienen los medicamentos de uso tpico para ayudarle a seguir las instrucciones sobre dnde y cmo usarlos. Las farmacias generalmente imprimen las instrucciones del medicamento slo en las cajas y no directamente en los tubos del medicamento.   Si su medicamento es muy caro, por favor, pngase en contacto con nuestra oficina llamando al [phone] y presione la opcin 4 o envenos un mensaje a travs de MyChart.   No podemos decirle cul ser su copago por los medicamentos por adelantado ya que esto es diferente dependiendo de la cobertura de su seguro.  Sin embargo, es posible que podamos encontrar un medicamento sustituto a menor costo o llenar un formulario para que el seguro cubra el medicamento que se considera necesario.   Si se requiere una autorizacin previa para que su compaa de seguros cubra su medicamento, por favor permtanos de 1 a 2 das hbiles para completar este proceso.  Los precios de los medicamentos varan con frecuencia dependiendo del lugar de dnde se surte la receta y alguna farmacias pueden ofrecer precios ms baratos.  El sitio web www.goodrx.com tiene cupones para medicamentos de diferentes farmacias. Los precios aqu no tienen en cuenta lo que podra costar con la ayuda del seguro (puede ser ms barato con su seguro), pero el sitio web puede darle el precio si no utiliz ningn seguro.  - Puede imprimir el cupn correspondiente y llevarlo con su receta a la farmacia.  - Tambin puede pasar por nuestra oficina durante el horario de atencin regular y recoger una tarjeta de cupones de GoodRx.  -   Si necesita que su receta se enve electrnicamente a una farmacia diferente, informe a nuestra oficina a travs de MyChart de [facility] o por telfono llamando al [phone] y presione la opcin 4.  

## 2022-03-09 NOTE — Progress Notes (Signed)
   Follow-Up Visit   Subjective  Abigail Cole is a 89 y.o. female who presents for the following: Follow-up (ISK follow up of top of scalp, right sideburn and left temple/sideburn treated with LN2). Accompanied by care giver  The following portions of the chart were reviewed this encounter and updated as appropriate:   Tobacco  Allergies  Meds  Problems  Med Hx  Surg Hx  Fam Hx     Review of Systems:  No other skin or systemic complaints except as noted in HPI or Assessment and Plan.  Objective  Well appearing patient in no apparent distress; mood and affect are within normal limits.  A focused examination was performed including scalp, face, arms, hands. Relevant physical exam findings are noted in the Assessment and Plan.  Scalp x 9, arms/hands x 6 (15) Erythematous stuck-on, waxy papule or plaque   Assessment & Plan   Purpura - Chronic; persistent and recurrent.  Treatable, but not curable. - Violaceous macules and patches - Benign - Related to trauma, age, sun damage and/or use of blood thinners, chronic use of topical and/or oral steroids - Observe - Can use OTC arnica containing moisturizer such as Dermend Bruise Formula if desired - Call for worsening or other concerns  Actinic Damage - chronic, secondary to cumulative UV radiation exposure/sun exposure over time - diffuse scaly erythematous macules with underlying dyspigmentation - Recommend daily broad spectrum sunscreen SPF 30+ to sun-exposed areas, reapply every 2 hours as needed.  - Recommend staying in the shade or wearing long sleeves, sun glasses (UVA+UVB protection) and wide brim hats (4-inch brim around the entire circumference of the hat). - Call for new or changing lesions.  Seborrheic Keratoses - Stuck-on, waxy, tan-brown papules and/or plaques  - Benign-appearing - Discussed benign etiology and prognosis. - Observe - Call for any changes  Inflamed seborrheic keratosis (15) Scalp x 9,  arms/hands x 6  Destruction of lesion - Scalp x 9, arms/hands x 6 Complexity: simple   Destruction method: cryotherapy   Informed consent: discussed and consent obtained   Timeout:  patient name, date of birth, surgical site, and procedure verified Lesion destroyed using liquid nitrogen: Yes   Region frozen until ice ball extended beyond lesion: Yes   Outcome: patient tolerated procedure well with no complications   Post-procedure details: wound care instructions given    Related Medications mometasone (ELOCON) 0.1 % cream Apply 1 application. topically as directed. Qd up to 5d/wk aa right chest until clear   Return in about 6 months (around 09/07/2022) for ISK follow up.  I, Ashok Cordia, CMA, am acting as scribe for Sarina Ser, MD . Documentation: I have reviewed the above documentation for accuracy and completeness, and I agree with the above.  Sarina Ser, MD

## 2022-03-17 ENCOUNTER — Encounter: Payer: Self-pay | Admitting: Dermatology

## 2022-08-31 ENCOUNTER — Ambulatory Visit: Payer: Medicare Other | Admitting: Dermatology

## 2022-09-21 ENCOUNTER — Ambulatory Visit: Payer: Medicare Other | Admitting: Dermatology

## 2023-08-03 ENCOUNTER — Emergency Department
Admission: EM | Admit: 2023-08-03 | Discharge: 2023-08-04 | Disposition: A | Discharge status: Home / Self Care | Attending: Emergency Medicine | Admitting: Emergency Medicine

## 2023-08-03 ENCOUNTER — Other Ambulatory Visit: Payer: Self-pay

## 2023-08-03 ENCOUNTER — Emergency Department

## 2023-08-03 DIAGNOSIS — S8992XA Unspecified injury of left lower leg, initial encounter: Secondary | ICD-10-CM | POA: Diagnosis present

## 2023-08-03 DIAGNOSIS — S82102A Unspecified fracture of upper end of left tibia, initial encounter for closed fracture: Secondary | ICD-10-CM | POA: Diagnosis not present

## 2023-08-03 DIAGNOSIS — S81812A Laceration without foreign body, left lower leg, initial encounter: Secondary | ICD-10-CM

## 2023-08-03 DIAGNOSIS — W010XXA Fall on same level from slipping, tripping and stumbling without subsequent striking against object, initial encounter: Secondary | ICD-10-CM | POA: Diagnosis not present

## 2023-08-03 DIAGNOSIS — Z23 Encounter for immunization: Secondary | ICD-10-CM | POA: Insufficient documentation

## 2023-08-03 MED ORDER — OXYCODONE-ACETAMINOPHEN 5-325 MG PO TABS
1.0000 | ORAL_TABLET | Freq: Once | ORAL | Status: AC
  Administered 2023-08-03: 1 via ORAL
  Filled 2023-08-03: qty 1

## 2023-08-03 MED ORDER — OXYCODONE-ACETAMINOPHEN 5-325 MG PO TABS
1.0000 | ORAL_TABLET | Freq: Once | ORAL | Status: AC
  Administered 2023-08-04: 1 via ORAL
  Filled 2023-08-03: qty 1

## 2023-08-03 MED ORDER — TETANUS-DIPHTH-ACELL PERTUSSIS 5-2.5-18.5 LF-MCG/0.5 IM SUSY
0.5000 mL | PREFILLED_SYRINGE | Freq: Once | INTRAMUSCULAR | Status: AC
  Administered 2023-08-03: 0.5 mL via INTRAMUSCULAR
  Filled 2023-08-03: qty 0.5

## 2023-08-03 MED ORDER — LIDOCAINE HCL (PF) 1 % IJ SOLN
10.0000 mL | Freq: Once | INTRAMUSCULAR | Status: AC
  Administered 2023-08-03: 10 mL via INTRADERMAL
  Filled 2023-08-03: qty 10

## 2023-08-03 MED ORDER — OXYCODONE HCL 5 MG PO TABS: 5.0000 mg | ORAL_TABLET | Freq: Three times a day (TID) | ORAL | 0 refills | Status: AC | PRN

## 2023-08-03 NOTE — ED Notes (Signed)
 Lac cart moved outside room, lido at bedside

## 2023-08-03 NOTE — Discharge Instructions (Addendum)
 You have 4 stitches in your leg which will need to come out in 1 week with orthopedics.  You will also follow-up with them in 1 week for repeat evaluation.  Please keep knee immobilizer on at all times to help with healing of the fracture to your proximal tibia.  Change the bandages once daily.  You can follow-up the wound with your primary care provider as well.  Place Vaseline gauze on top first and then cover with regular gauze and then with the Kerlix circular wrap.

## 2023-08-03 NOTE — ED Provider Notes (Signed)
 [facility] Provider Note    Event Date/Time   First MD Initiated Contact with Patient 08/03/23 1932     (approximate)   History   Fall   HPI Abigail Cole is a 89 y.o. female with history of muscular dystrophy presenting today for fall.  Patient was reportedly transferring from recliner when she slipped out and fell on her left knee.  Has a laceration to her left shin.  Nonweightbearing at baseline.  No blood thinners.  Did not hit her head or neck.  Denies pain symptoms elsewhere throughout her body besides the left shin and left knee.     Physical Exam   Triage Vital Signs: ED Triage Vitals [08/03/23 1938]  Encounter Vitals Group     BP      Girls Systolic BP Percentile      Girls Diastolic BP Percentile      Boys Systolic BP Percentile      Boys Diastolic BP Percentile      Pulse      Resp      Temp      Temp src      SpO2      Weight 145 lb (65.8 kg)     Height 5' 1 (1.549 m)     Head Circumference      Peak Flow      Pain Score      Pain Loc      Pain Education      Exclude from Growth Chart     Most recent vital signs: Vitals:   08/03/23 1943  BP: (!) 166/85  Pulse: (!) 105  Resp: 18  Temp: 98.1 F (36.7 C)  SpO2: 96%    I have reviewed the vital signs. General:  Awake, alert, no acute distress. Head:  Normocephalic, Atraumatic. EENT:  PERRL, EOMI, Oral mucosa pink and moist, Neck is supple. Cardiovascular: Regular rate, 2+ distal pulses. Respiratory:  Normal respiratory effort, symmetrical expansion, no distress.   Extremities: Functional quadriplegia for muscular dystrophy Neuro:  Alert and oriented.  Interacting appropriately.   Skin: 5 cm laceration with bruising noted over anterior left shin Psych: Appropriate affect.    ED Results / Procedures / Treatments   Labs (all labs ordered are listed, but only abnormal results are displayed) Labs Reviewed - No data to  display   EKG    RADIOLOGY Independently interpreted x-ray with evidence of proximal tibial fracture   PROCEDURES:  Critical Care performed: No  .Laceration Repair  Date/Time: 08/03/2023 11:15 PM  Performed by: Kandee Orion, MD Authorized by: Kandee Orion, MD   Consent:    Consent obtained:  Verbal   Consent given by:  Patient   Risks, benefits, and alternatives were discussed: yes     Risks discussed:  Infection, pain, poor cosmetic result and poor wound healing   Alternatives discussed:  No treatment Universal protocol:    Patient identity confirmed:  Verbally with patient Anesthesia:    Anesthesia method:  Local infiltration   Local anesthetic:  Lidocaine  1% w/o epi Laceration details:    Location:  Leg   Leg location:  L lower leg   Length (cm):  6   Depth (mm):  2 Pre-procedure details:    Preparation:  Imaging obtained to evaluate for foreign bodies Exploration:    Limited defect created (wound extended): no     Hemostasis achieved with:  Direct pressure   Imaging obtained: x-ray  Imaging outcome: foreign body not noted     Wound exploration: wound explored through full range of motion   Treatment:    Area cleansed with:  Chlorhexidine   Amount of cleaning:  Standard   Irrigation solution:  Sterile saline   Irrigation volume:  1000   Irrigation method:  Tap Skin repair:    Repair method:  Sutures   Suture size:  4-0   Suture material:  Prolene   Suture technique:  Simple interrupted   Number of sutures:  4 Approximation:    Approximation:  Loose Repair type:    Repair type:  Simple Post-procedure details:    Dressing:  Non-adherent dressing, sterile dressing and tube gauze   Procedure completion:  Tolerated well, no immediate complications    MEDICATIONS ORDERED IN ED: Medications  Tdap (BOOSTRIX) injection 0.5 mL (has no administration in time range)  oxyCODONE-acetaminophen  (PERCOCET/ROXICET) 5-325 MG per tablet 1 tablet (has no  administration in time range)  oxyCODONE-acetaminophen  (PERCOCET/ROXICET) 5-325 MG per tablet 1 tablet (1 tablet Oral Given 08/03/23 2035)  lidocaine  (PF) (XYLOCAINE ) 1 % injection 10 mL (10 mLs Intradermal Given by Other 08/03/23 2039)     IMPRESSION / MDM / ASSESSMENT AND PLAN / ED COURSE  I reviewed the triage vital signs and the nursing notes.                              Differential diagnosis includes, but is not limited to, leg laceration, tibia/fibula fracture, patella fracture  Patient's presentation is most consistent with acute complicated illness / injury requiring diagnostic workup.  Patient is an 89 year old female with muscular dystrophy presenting today for fall during transfer.  Did not hit her head or neck.  No loss of consciousness.  Exam shows laceration over the anterior left shin with bruising around it.  Mild tenderness palpation throughout the shin and knee.  Will get x-rays for further evaluation.  Not on blood thinners.  X-ray shows evidence of proximal tibial plateau fracture.  Do not think patient needs any kind of surgical procedure given she is nonweightbearing and nonambulatory at her baseline from her chronic muscular dystrophy.  I did discuss with orthopedics any additional outpatient management elsewise.  They recommended placing in a knee immobilizer and follow-up in 1 week for repeat x-rays.  Separately, the wound on her left leg was repaired with 4 nonabsorbable sutures.  These were loosely applied as she has very fragile skin which kept ripping.  Placed with extensive bandage and family sent home with supplies for replacing daily and follow-up with PCP and orthopedics.  Clinical Course as of 08/03/23 2315  Thu Aug 03, 2023  2129 Spoke with orthopedics recommends long knee immobilizer. Follow up outpatient [DW]    Clinical Course User Index [DW] Kandee Orion, MD     FINAL CLINICAL IMPRESSION(S) / ED DIAGNOSES   Final diagnoses:  Closed fracture of  proximal end of left tibia, unspecified fracture morphology, initial encounter  Laceration of left lower extremity, initial encounter     Rx / DC Orders   ED Discharge Orders          Ordered    oxyCODONE (ROXICODONE) 5 MG immediate release tablet  Every 8 hours PRN        08/03/23 2259    AMB referral to orthopedics        08/03/23 2259  Note:  This document was prepared using Dragon voice recognition software and may include unintentional dictation errors.   Kandee Orion, MD 08/03/23 [phone]

## 2023-08-03 NOTE — ED Triage Notes (Addendum)
 BIB ems from home for fall  Per EMS she was being moved to her recliner and slipped and fell hitting left knee.  Has lac to left leg  No blood thinners, history of MS non weight bearing at baseline  A&Ox4

## 2023-08-04 DIAGNOSIS — S82102A Unspecified fracture of upper end of left tibia, initial encounter for closed fracture: Secondary | ICD-10-CM | POA: Diagnosis not present

## 2024-02-26 ENCOUNTER — Other Ambulatory Visit: Payer: Self-pay | Admitting: Physician Assistant

## 2024-02-26 ENCOUNTER — Ambulatory Visit
Admission: RE | Admit: 2024-02-26 | Discharge: 2024-02-26 | Disposition: A | Discharge status: Home / Self Care | Source: Ambulatory Visit | Attending: Physician Assistant | Admitting: Physician Assistant

## 2024-02-26 DIAGNOSIS — R531 Weakness: Secondary | ICD-10-CM | POA: Diagnosis present

## 2024-02-26 NOTE — Progress Notes (Signed)
 Chief Complaint  Patient presents with   Follow-up    Starting last Sunday she starting have left side weakness. Refused EMS to come assess. Doing better then got worse on Wed. Pain in back and arms. Can't use left arm at ll. Left side of face has dropped a but. Can't feed herself anymore.      HPI  Abigail Cole is a 89 y.o. here for an acute issue.  She has a PMH of TIA, muscular dystrophy/wheelchair-bound, OA, HTN, B12 deficiency, allergies who reportedly developed acute left arm weakness last week but refused to go to the emergency room.  Family and staff also noted a drawing to the left side while in the wheelchair.  She normally was able to use her left arm to feed herself but now is unable.  Has had worsening back pain and pain between her shoulder blades as well.  She does have chronic limited use of her extremities, limited shoulder use as well as limited lower extremity strength.  She requires total assistance with transfers.    ROS  Pertinent items are noted in HPI.  Outpatient Encounter Medications as of 02/26/2024  Medication Sig Dispense Refill   acetaminophen  (TYLENOL ) 500 MG tablet Take 500 mg by mouth nightly Per patient, 2 tabs nightly     amitriptyline  (ELAVIL ) 10 MG tablet Take 1 tablet (10 mg total) by mouth at bedtime 90 tablet 3   amLODIPine (NORVASC) 5 MG tablet Take 1 tablet (5 mg total) by mouth at bedtime 90 tablet 3   aspirin  81 MG chewable tablet Take by mouth     cholecalciferol (VITAMIN D3) 2,000 unit capsule Take 2,000 Units by mouth once daily.     cyanocobalamin (VITAMIN B12) 1000 MCG tablet Take 1,000 mcg by mouth once daily Per patient , Takes Monday and Friday.       fluticasone propionate (FLONASE) 50 mcg/actuation nasal spray SHAKE LIQUID AND USE 2 SPRAYS IN EACH NOSTRIL DAILY 16 g 5   losartan  (COZAAR ) 100 MG tablet Take 1 tablet (100 mg total) by mouth once daily 90 tablet 3   PARoxetine  (PAXIL ) 10 MG tablet Take 1 tablet (10 mg total) by  mouth once daily 90 tablet 3   silver sulfADIAZINE (SSD) 1 % cream APPLY TOPICALLY TO THE AFFECTED AREA TWICE DAILY 200 g 2   No facility-administered encounter medications on file as of 02/26/2024.    Allergies as of 02/26/2024 - Reviewed 02/26/2024  Allergen Reaction Noted   Ace inhibitors Cough 03/22/2016   Clarithromycin Nausea And Vomiting 01/29/2016   Codeine Nausea And Vomiting 01/29/2016   Dye Swelling 10/07/2013   Metrizamide Swelling 01/29/2016    Past Medical History:  Diagnosis Date   Arthritis    Fibrocystic breast disease    MD (muscular dystrophy) (CMS/HHS-HCC)    Postmenopause    TIA (transient ischemic attack) 12/17/2020   10/22, transient dysarthria, brain MRI/MRA/neck MRA normal, normal echo Plavix /aspirin  x3 weeks    Past Surgical History:  Procedure Laterality Date   CATARACT EXTRACTION Bilateral 2018    Vitals:   02/26/24 1111  BP: 130/82  Pulse: 102    Physical Exam  General. Well appearing; NAD; VS reviewed     HEENT: Sclera and conjunctiva clear; EOMI,  Neck.  Very limited range of motion Lungs. Respirations unlabored; clear to auscultation bilaterally. Cardiovascular. Heart regular rate and rhythm without murmurs, gallops, or rubs. Extremities:  No edema. Skin. Normal color and turgor Neurologic. Alert and oriented x3, equal strength  and grip bilaterally without flexion of the left elbow or shoulder..  Assessment and Plan 1. Left-sided weakness With a history of profound muscular dystrophy, very limited strength in her upper and lower extremities.  However family, staff have noticed left hand, arm weakness and unable to lift.  Unable to feed herself.  This happened over a week ago.  No mental status changes. -     CT brain without contrast; Future -     CBC w/auto Differential (5 Part) -     Comprehensive Metabolic Panel (CMP) -     Urinalysis w/Microscopic -     Thyroid Stimulating-Hormone (TSH)  2. Chronic bilateral low  back pain without sciatica  -     celecoxib (CELEBREX) 100 MG capsule; Take 1 capsule (100 mg total) by mouth 2 (two) times daily for 30 days -     tiZANidine (ZANAFLEX) 2 MG tablet; Take 1 tablet (2 mg total) by mouth 3 (three) times daily as needed  3. Rhomboid muscle pain  -     celecoxib (CELEBREX) 100 MG capsule; Take 1 capsule (100 mg total) by mouth 2 (two) times daily for 30 days -     tiZANidine (ZANAFLEX) 2 MG tablet; Take 1 tablet (2 mg total) by mouth 3 (three) times daily as needed    I have personally performed this service.  [address] [place], [place]

## 2024-02-27 ENCOUNTER — Other Ambulatory Visit: Payer: Self-pay | Admitting: Physician Assistant

## 2024-02-27 DIAGNOSIS — I639 Cerebral infarction, unspecified: Secondary | ICD-10-CM

## 2024-02-28 ENCOUNTER — Ambulatory Visit
Admission: RE | Admit: 2024-02-28 | Discharge: 2024-02-28 | Disposition: A | Discharge status: Home / Self Care | Source: Ambulatory Visit | Attending: Physician Assistant | Admitting: Physician Assistant

## 2024-02-28 ENCOUNTER — Other Ambulatory Visit: Payer: Self-pay | Admitting: Physician Assistant

## 2024-02-28 DIAGNOSIS — I639 Cerebral infarction, unspecified: Secondary | ICD-10-CM
